# Patient Record
Sex: Male | Born: 1975 | Race: Black or African American | Hispanic: No | Marital: Married | Smoking: Current every day smoker
Health system: Southern US, Community
[De-identification: ages and names within clinical notes are randomized; demographics above are authoritative.]

## PROBLEM LIST (undated history)

## (undated) DIAGNOSIS — R0602 Shortness of breath: Secondary | ICD-10-CM

## (undated) DIAGNOSIS — E739 Lactose intolerance, unspecified: Secondary | ICD-10-CM

## (undated) DIAGNOSIS — K5900 Constipation, unspecified: Secondary | ICD-10-CM

## (undated) DIAGNOSIS — F1010 Alcohol abuse, uncomplicated: Secondary | ICD-10-CM

## (undated) DIAGNOSIS — R12 Heartburn: Secondary | ICD-10-CM

## (undated) DIAGNOSIS — G4730 Sleep apnea, unspecified: Secondary | ICD-10-CM

## (undated) DIAGNOSIS — I10 Essential (primary) hypertension: Secondary | ICD-10-CM

## (undated) HISTORY — DX: Shortness of breath: R06.02

## (undated) HISTORY — DX: Constipation, unspecified: K59.00

## (undated) HISTORY — DX: Sleep apnea, unspecified: G47.30

## (undated) HISTORY — DX: Essential (primary) hypertension: I10

## (undated) HISTORY — DX: Alcohol abuse, uncomplicated: F10.10

## (undated) HISTORY — DX: Heartburn: R12

## (undated) HISTORY — PX: NO PAST SURGERIES: SHX2092

## (undated) HISTORY — DX: Lactose intolerance, unspecified: E73.9

---

## 2004-09-12 ENCOUNTER — Ambulatory Visit: Payer: Self-pay | Admitting: Internal Medicine

## 2005-05-02 ENCOUNTER — Emergency Department (HOSPITAL_COMMUNITY): Admission: EM | Admit: 2005-05-02 | Discharge: 2005-05-02 | Payer: Self-pay | Admitting: Emergency Medicine

## 2005-09-22 DIAGNOSIS — M19019 Primary osteoarthritis, unspecified shoulder: Secondary | ICD-10-CM | POA: Insufficient documentation

## 2007-10-29 ENCOUNTER — Ambulatory Visit: Payer: Self-pay | Admitting: Family Medicine

## 2008-04-26 IMAGING — CT CT HEAD WITHOUT CONTRAST
2 series · 16 of 30 positions shown, 20 images · non-contrast
Comparison: none

REASON FOR EXAM: rt facial numbness
COMMENTS:

[Series 2: without · axial · non-contrast · 0.40mm/px · z∈[-151,-21]mm · 13 of 32 slices shown, 17 images]
[im 3/32  brain]
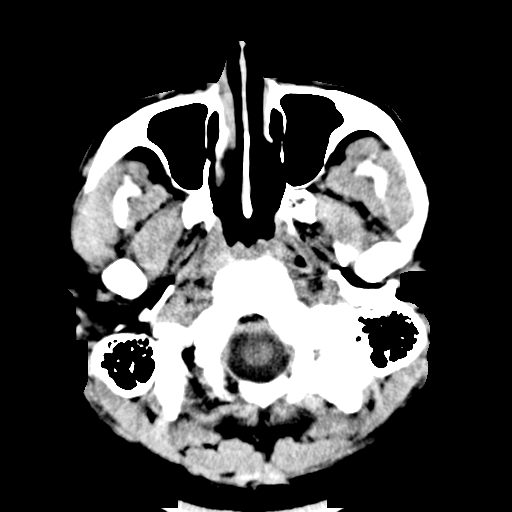
[im 3/32  bone]
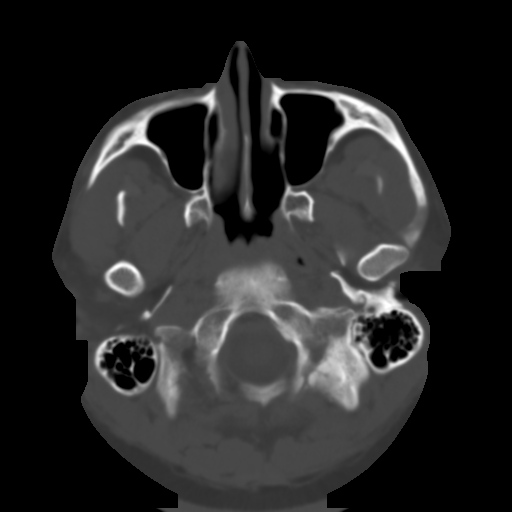
[im 5/32  brain]
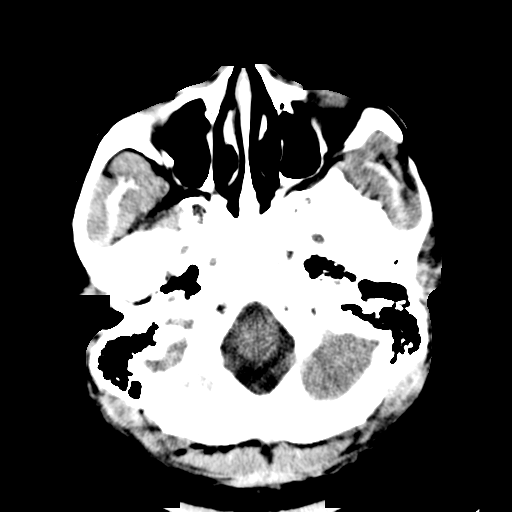
[im 7/32  brain]
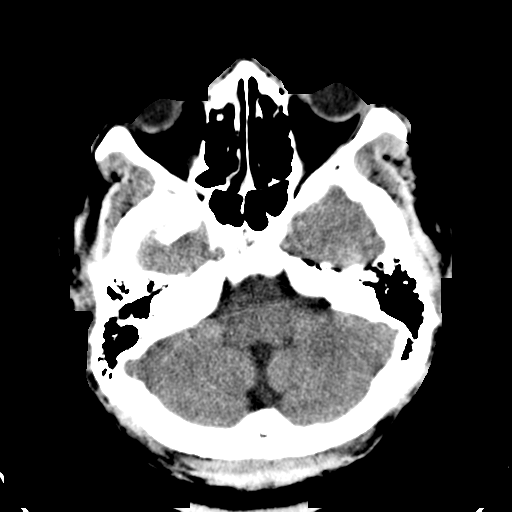
[im 9/32  brain]
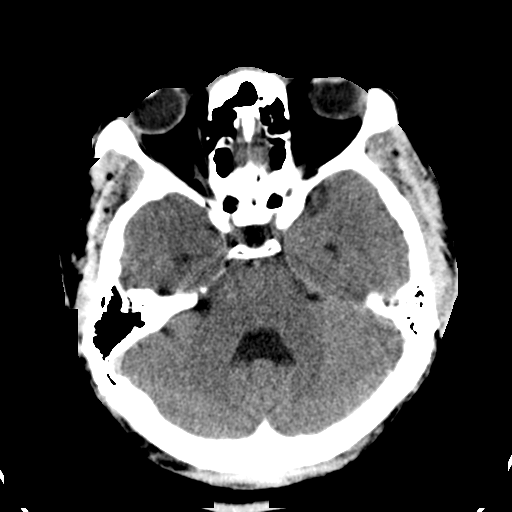
[im 12/32  brain]
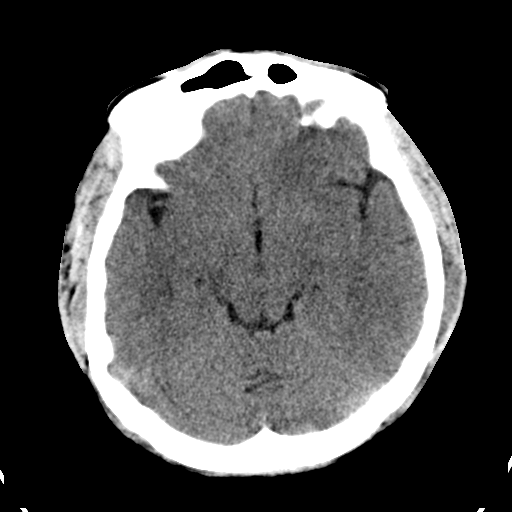
[im 12/32  bone]
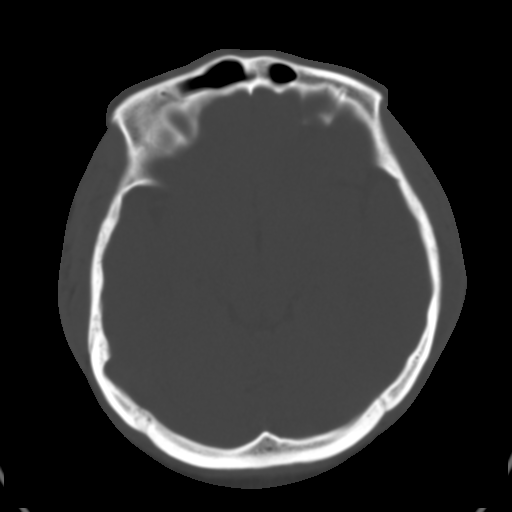
[im 14/32  brain]
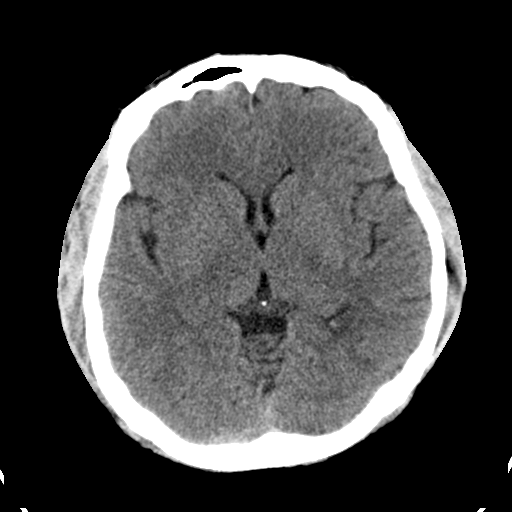
[im 16/32  brain]
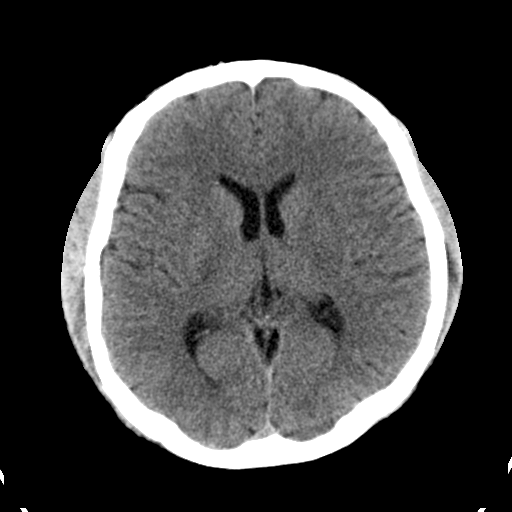
[im 18/32  brain]
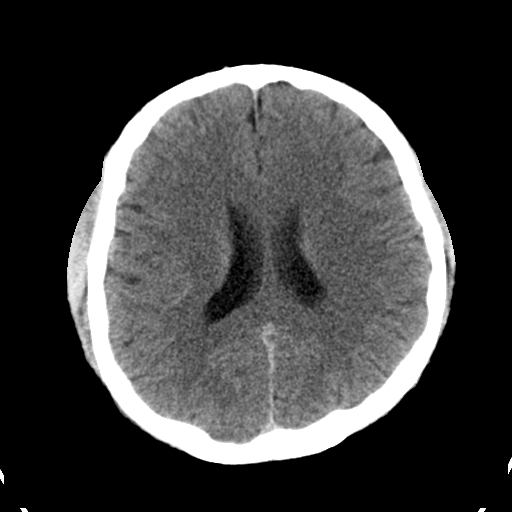
[im 20/32  brain]
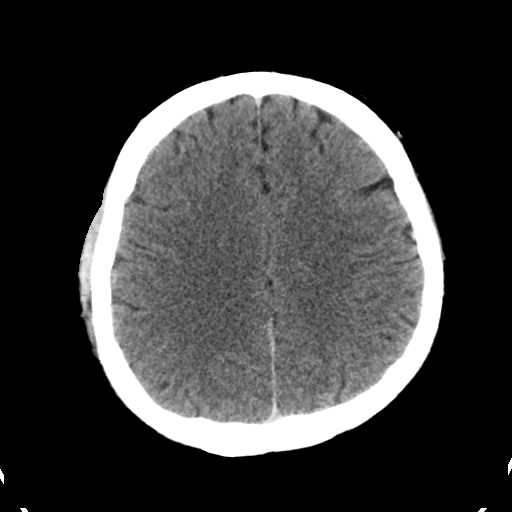
[im 20/32  bone]
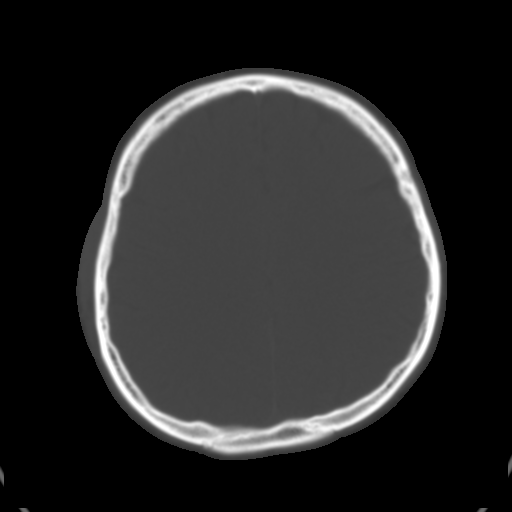
[im 23/32  brain]
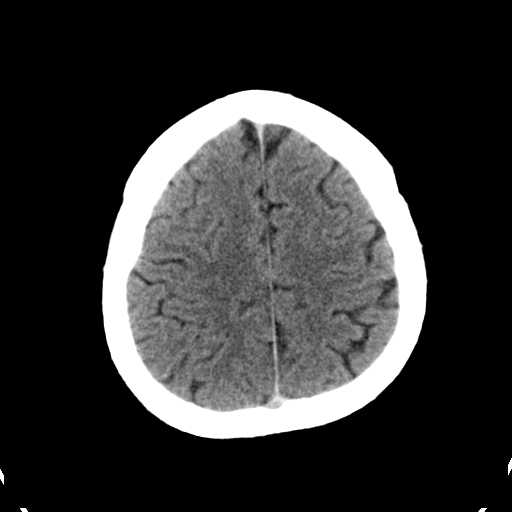
[im 25/32  brain]
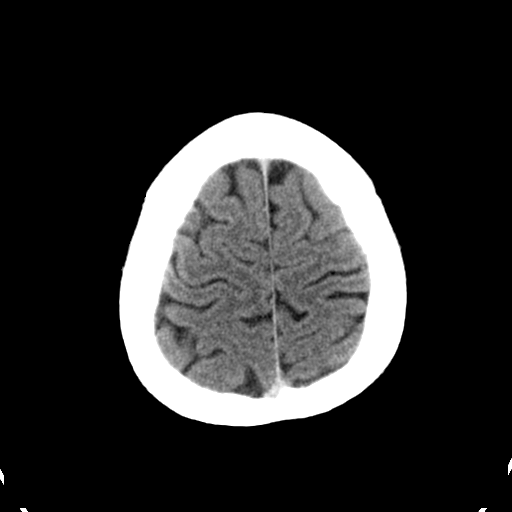
[im 27/32  brain]
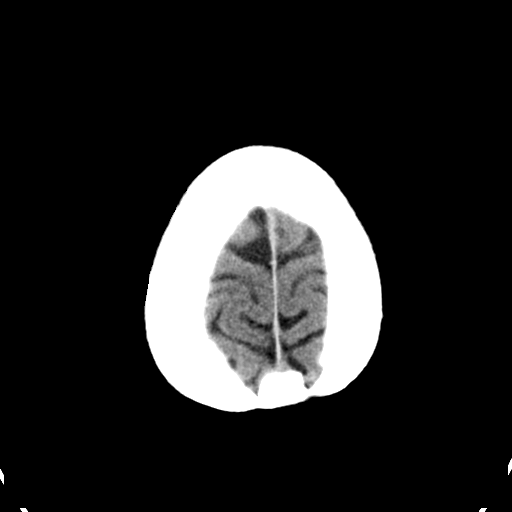
[im 29/32  brain]
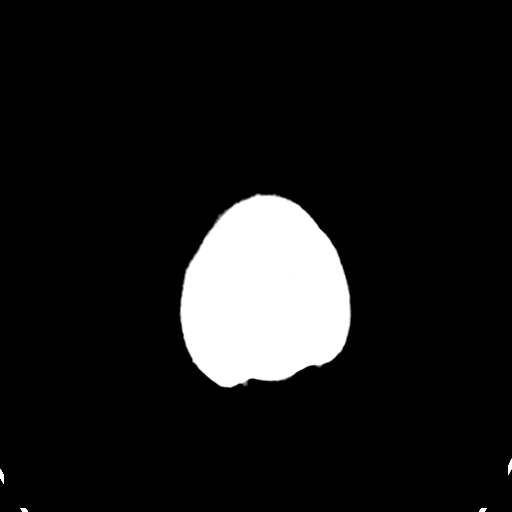
[im 29/32  bone]
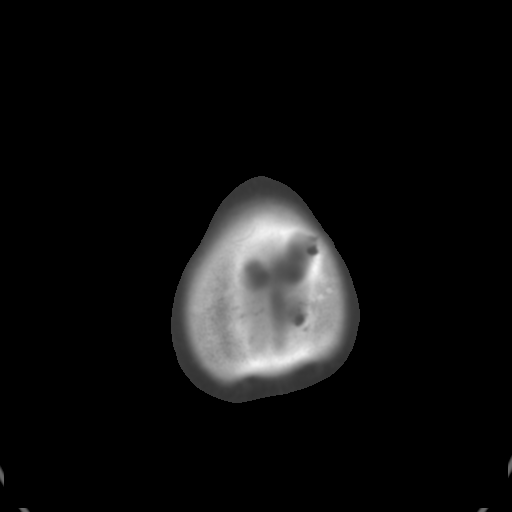

[Series 3: bone · axial · 0.40mm/px · z∈[-151,-106]mm · 3 of 32 slices shown]
[im 3/32  bone]
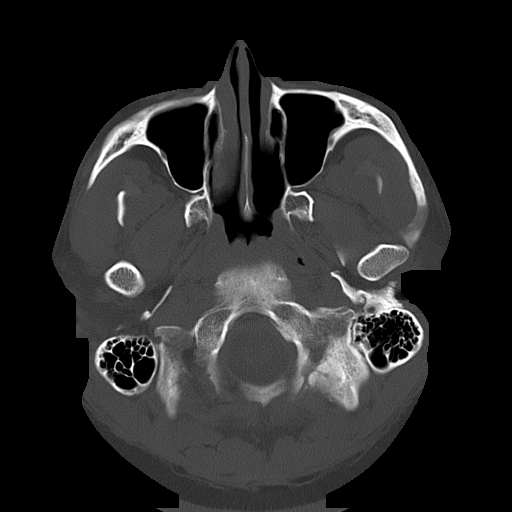
[im 7/32  bone]
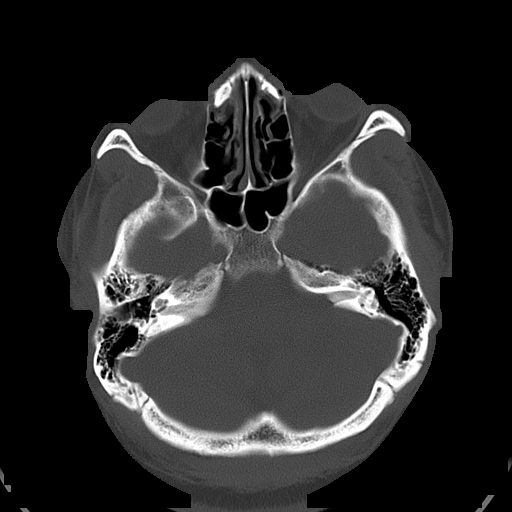
[im 12/32  bone]
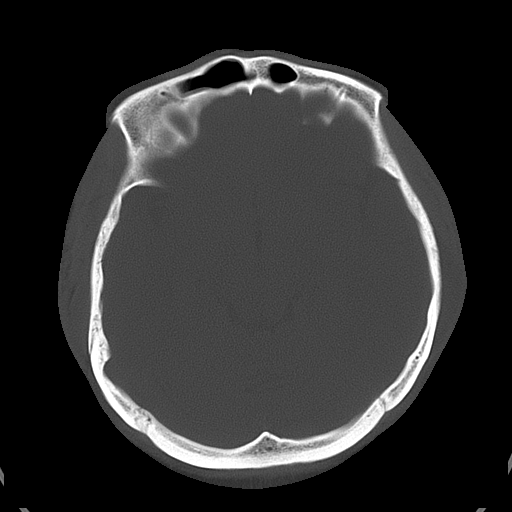

[16 of 30 positions shown; findings below may reference images not displayed]

PROCEDURE:     CT  - CT HEAD WITHOUT CONTRAST  - October 29, 2007  [DATE]

RESULT:     There is no evidence of intra-axial or extra-axial fluid
collections.  There is no evidence of acute hemorrhage or secondary signs
reflecting mass effect, subacute or chronic infarction.  The visualized bony
skeleton is evaluated and there is no evidence of depressed skull fracture
or further fracture or dislocation.  The visualized mastoid air cells are
clear.  The ventricles, cisterns and sulci are symmetric and patent.
IMPRESSION: 1)No evidence of acute intracranial abnormalities as described above.

2)If there is persistent clinical concern, further evaluation with Brain MRI
is recommended if clinically warranted.

## 2009-03-20 DIAGNOSIS — I10 Essential (primary) hypertension: Secondary | ICD-10-CM | POA: Insufficient documentation

## 2009-03-20 DIAGNOSIS — Z720 Tobacco use: Secondary | ICD-10-CM | POA: Insufficient documentation

## 2015-06-19 ENCOUNTER — Other Ambulatory Visit: Payer: Self-pay | Admitting: Family Medicine

## 2015-06-19 NOTE — Telephone Encounter (Signed)
Last Ov was on 06/22/2013.

## 2015-12-07 ENCOUNTER — Other Ambulatory Visit: Payer: Self-pay | Admitting: Family Medicine

## 2015-12-08 NOTE — Telephone Encounter (Signed)
Last OV 06/2013  Thanks,   -Vernona RiegerLaura

## 2015-12-12 ENCOUNTER — Other Ambulatory Visit: Payer: Self-pay | Admitting: Family Medicine

## 2015-12-12 NOTE — Telephone Encounter (Signed)
Last OV 06/2013

## 2015-12-14 MED ORDER — TRIAMTERENE-HCTZ 37.5-25 MG PO TABS: 2.0000 | ORAL_TABLET | Freq: Every day | ORAL | Status: DC

## 2015-12-14 NOTE — Addendum Note (Signed)
Addended by: Leo Grosser on: 12/14/2015 08:52 AM   Modules accepted: Orders

## 2016-01-18 ENCOUNTER — Other Ambulatory Visit: Payer: Self-pay | Admitting: Family Medicine

## 2016-01-18 DIAGNOSIS — I10 Essential (primary) hypertension: Secondary | ICD-10-CM

## 2016-01-19 ENCOUNTER — Other Ambulatory Visit: Payer: Self-pay | Admitting: Family Medicine

## 2016-01-19 DIAGNOSIS — I10 Essential (primary) hypertension: Secondary | ICD-10-CM

## 2016-01-27 ENCOUNTER — Ambulatory Visit (INDEPENDENT_AMBULATORY_CARE_PROVIDER_SITE_OTHER): Payer: Managed Care, Other (non HMO) | Admitting: Family Medicine

## 2016-01-27 ENCOUNTER — Encounter: Payer: Self-pay | Admitting: Family Medicine

## 2016-01-27 VITALS — BP 172/120 | HR 84 | Temp 97.9°F | Resp 16 | Ht 69.0 in | Wt 234.0 lb

## 2016-01-27 DIAGNOSIS — E7800 Pure hypercholesterolemia, unspecified: Secondary | ICD-10-CM | POA: Insufficient documentation

## 2016-01-27 DIAGNOSIS — R748 Abnormal levels of other serum enzymes: Secondary | ICD-10-CM | POA: Insufficient documentation

## 2016-01-27 DIAGNOSIS — I10 Essential (primary) hypertension: Secondary | ICD-10-CM | POA: Diagnosis not present

## 2016-01-27 DIAGNOSIS — G4730 Sleep apnea, unspecified: Secondary | ICD-10-CM | POA: Insufficient documentation

## 2016-01-27 DIAGNOSIS — E669 Obesity, unspecified: Secondary | ICD-10-CM | POA: Insufficient documentation

## 2016-01-27 DIAGNOSIS — J309 Allergic rhinitis, unspecified: Secondary | ICD-10-CM | POA: Insufficient documentation

## 2016-01-27 LAB — POCT URINALYSIS DIPSTICK
Bilirubin, UA: NEGATIVE
Glucose, UA: NEGATIVE
KETONES UA: NEGATIVE
Leukocytes, UA: NEGATIVE
Nitrite, UA: NEGATIVE
PH UA: 7.5
PROTEIN UA: NEGATIVE
SPEC GRAV UA: 1.01
Urobilinogen, UA: 0.2

## 2016-01-27 MED ORDER — METOPROLOL SUCCINATE ER 25 MG PO TB24: 25.0000 mg | ORAL_TABLET | Freq: Every day | ORAL | Status: DC

## 2016-01-27 MED ORDER — TRIAMTERENE-HCTZ 37.5-25 MG PO TABS
2.0000 | ORAL_TABLET | Freq: Every day | ORAL | Status: DC
Start: 2016-01-27 — End: 2016-04-29

## 2016-01-27 MED ORDER — AMLODIPINE BESYLATE 5 MG PO TABS: 5.0000 mg | ORAL_TABLET | Freq: Every day | ORAL | Status: DC

## 2016-01-27 NOTE — Progress Notes (Signed)
Patient ID: Arthur Bradley, male   DOB: [birth year], 40 y.o.   MRN: [id number]        Patient: Arthur Bradley Male    DOB: [birth year]   40 y.o.   MRN: [id number] Visit Date: 01/27/2016  Today's Provider: Lorie PhenixNancy Nosson Wender, MD   Chief Complaint  Patient presents with  . Hypertension  . Hyperlipidemia   Subjective:    HPI     Hypertension, follow-up:  BP Readings from Last 3 Encounters:  01/27/16 172/120  06/22/13 172/118    He was last seen for hypertension 2 years ago.  Management since that visit includes Started Metoprolol.He reports fair compliance with treatment.  Pt says he ran out of his Metoprolol  He is not having side effects.  He is exercising. He is not adherent to low salt diet.   Outside blood pressures are pt says his "Blood has been running high". He is experiencing none.  Patient denies chest pain.   Cardiovascular risk factors include dyslipidemia and male gender.  ------------------------------------------------------------------------    Lipid/Cholesterol, Follow-up:   Last seen for this 2 years ago.  Management since that visit includes None.  Last Lipid Panel: No results found for: CHOL, TRIG, HDL, CHOLHDL, VLDL, LDLCALC, LDLDIRECT   Wt Readings from Last 3 Encounters:  01/27/16 234 lb (106.142 kg)  06/22/13 240 lb (108.863 kg)    ------------------------------------------------------------------------   Has sleep apnea.   Does use his CPAP most of the time.       No Known Allergies Previous Medications   TRIAMTERENE-HYDROCHLOROTHIAZIDE (MAXZIDE-25) 37.5-25 MG TABLET    Take 2 tablets by mouth daily. Pt needs an office visit before anymore refills.    Review of Systems  Constitutional: Positive for fatigue. Negative for fever, chills, diaphoresis, activity change, appetite change and unexpected weight change.  Respiratory: Positive for cough (Occasionally). Negative for apnea, choking, chest tightness, shortness of breath, wheezing  and stridor.   Cardiovascular: Negative.   Gastrointestinal: Negative.   Musculoskeletal: Negative.   Neurological: Negative for dizziness, tremors, seizures, syncope, weakness, light-headedness and headaches.    Social History  Substance Use Topics  . Smoking status: Current Every Day Smoker -- 0 years    Types: E-cigarettes  . Smokeless tobacco: Never Used  . Alcohol Use: Yes     Comment: Rarely   Objective:   BP 172/120 mmHg  Pulse 84  Temp(Src) 97.9 F (36.6 C) (Oral)  Resp 16  Ht 5\' 9"  (1.753 m)  Wt 234 lb (106.142 kg)  BMI 34.54 kg/m2  Physical Exam  Constitutional: He is oriented to person, place, and time. He appears well-developed and well-nourished.  Cardiovascular: Normal rate, regular rhythm and normal heart sounds.   Pulmonary/Chest: Effort normal and breath sounds normal.  Neurological: He is alert and oriented to person, place, and time.  Psychiatric: He has a normal mood and affect. His behavior is normal. Judgment and thought content normal.      Assessment & Plan:       1. Benign essential HTN Elevated today; but pt reports he ran out of his medications.  Will restart today and recheck in six weeks.   - amLODipine (NORVASC) 5 MG tablet; Take 1 tablet (5 mg total) by mouth daily.  Dispense: 90 tablet; Refill: 1 - CBC with Differential/Platelet - Comprehensive metabolic panel - TSH - metoprolol succinate (TOPROL-XL) 25 MG 24 hr tablet; Take 1 tablet (25 mg total) by mouth daily.  Dispense: 90 tablet; Refill: 1  2. Hypercholesteremia  Will not check labs today as not fasting, High Ck previously. Will recheck  At follow up.    3. Apnea, sleep Pt reports using his Cpap but not every night.  Encourage pt to use his machine every night.    4. Essential hypertension, benign Elevated today; but pt reports he ran out of his medications.  Will restart today and recheck in six weeks.   - triamterene-hydrochlorothiazide (MAXZIDE-25) 37.5-25 MG tablet; Take 2  tablets by mouth daily.  Dispense: 180 tablet; Refill: 1  5. Elevated CK Has been elevated in the past, will recheck today.  - CK (Creatine Kinase)    Patient was seen and examined by Leo Grosser, MD, and note scribed by Kavin Leech, CMA.  I have reviewed the document for accuracy and completeness and I agree with above. - Leo Grosser, MD   Lorie Phenix, MD  [facility]

## 2016-01-28 ENCOUNTER — Telehealth: Payer: Self-pay | Admitting: Family Medicine

## 2016-01-28 DIAGNOSIS — R748 Abnormal levels of other serum enzymes: Secondary | ICD-10-CM

## 2016-01-28 LAB — COMPREHENSIVE METABOLIC PANEL
A/G RATIO: 1.9 (ref 1.1–2.5)
ALK PHOS: 56 IU/L (ref 39–117)
ALT: 55 IU/L — AB (ref 0–44)
AST: 78 IU/L — AB (ref 0–40)
Albumin: 5.3 g/dL (ref 3.5–5.5)
BUN/Creatinine Ratio: 15 (ref 8–19)
BUN: 19 mg/dL (ref 6–20)
Bilirubin Total: 0.6 mg/dL (ref 0.0–1.2)
CALCIUM: 10.5 mg/dL — AB (ref 8.7–10.2)
CO2: 25 mmol/L (ref 18–29)
CREATININE: 1.27 mg/dL (ref 0.76–1.27)
Chloride: 94 mmol/L — ABNORMAL LOW (ref 96–106)
GFR calc Af Amer: 82 mL/min/{1.73_m2} (ref >59)
GFR, EST NON AFRICAN AMERICAN: 71 mL/min/{1.73_m2} (ref >59)
Globulin, Total: 2.8 g/dL (ref 1.5–4.5)
Glucose: 105 mg/dL — ABNORMAL HIGH (ref 65–99)
POTASSIUM: 4.2 mmol/L (ref 3.5–5.2)
Sodium: 139 mmol/L (ref 134–144)
Total Protein: 8.1 g/dL (ref 6.0–8.5)

## 2016-01-28 LAB — CBC WITH DIFFERENTIAL/PLATELET
BASOS ABS: 0 10*3/uL (ref 0.0–0.2)
BASOS: 1 %
EOS (ABSOLUTE): 0.1 10*3/uL (ref 0.0–0.4)
EOS: 3 %
Hematocrit: 46.5 % (ref 37.5–51.0)
Hemoglobin: 15.7 g/dL (ref 12.6–17.7)
IMMATURE GRANULOCYTES: 0 %
Immature Grans (Abs): 0 10*3/uL (ref 0.0–0.1)
LYMPHS ABS: 1.5 10*3/uL (ref 0.7–3.1)
LYMPHS: 39 %
MCH: 30 pg (ref 26.6–33.0)
MCHC: 33.8 g/dL (ref 31.5–35.7)
MCV: 89 fL (ref 79–97)
MONOCYTES: 12 %
Monocytes Absolute: 0.5 10*3/uL (ref 0.1–0.9)
NEUTROS ABS: 1.8 10*3/uL (ref 1.4–7.0)
Neutrophils: 45 %
PLATELETS: 244 10*3/uL (ref 150–379)
RBC: 5.23 x10E6/uL (ref 4.14–5.80)
RDW: 15.5 % — AB (ref 12.3–15.4)
WBC: 3.9 10*3/uL (ref 3.4–10.8)

## 2016-01-28 LAB — TSH: TSH: 3.14 u[IU]/mL (ref 0.450–4.500)

## 2016-01-28 LAB — CK: CK TOTAL: 3850 U/L — AB (ref 24–204)

## 2016-01-30 NOTE — Telephone Encounter (Signed)
Lab slip printed for patient.

## 2016-01-31 LAB — COMPREHENSIVE METABOLIC PANEL
A/G RATIO: 1.7 (ref 1.1–2.5)
ALT: 62 IU/L — AB (ref 0–44)
AST: 52 IU/L — AB (ref 0–40)
Albumin: 5.1 g/dL (ref 3.5–5.5)
Alkaline Phosphatase: 46 IU/L (ref 39–117)
BILIRUBIN TOTAL: 0.8 mg/dL (ref 0.0–1.2)
BUN/Creatinine Ratio: 12 (ref 8–19)
BUN: 18 mg/dL (ref 6–20)
CHLORIDE: 93 mmol/L — AB (ref 96–106)
CO2: 23 mmol/L (ref 18–29)
Calcium: 10.2 mg/dL (ref 8.7–10.2)
Creatinine, Ser: 1.48 mg/dL — ABNORMAL HIGH (ref 0.76–1.27)
GFR calc non Af Amer: 59 mL/min/{1.73_m2} — ABNORMAL LOW (ref >59)
GFR, EST AFRICAN AMERICAN: 68 mL/min/{1.73_m2} (ref >59)
GLOBULIN, TOTAL: 3 g/dL (ref 1.5–4.5)
Glucose: 96 mg/dL (ref 65–99)
POTASSIUM: 4.2 mmol/L (ref 3.5–5.2)
SODIUM: 136 mmol/L (ref 134–144)
TOTAL PROTEIN: 8.1 g/dL (ref 6.0–8.5)

## 2016-01-31 LAB — CK: Total CK: 1405 U/L (ref 24–204)

## 2016-02-02 ENCOUNTER — Telehealth: Payer: Self-pay

## 2016-02-02 DIAGNOSIS — R748 Abnormal levels of other serum enzymes: Secondary | ICD-10-CM

## 2016-02-02 NOTE — Telephone Encounter (Signed)
-----   Message from Lorie PhenixNancy Maloney, MD sent at 01/31/2016  7:51 AM EST ----- CK improved but still off some. Kidney function and liver function also slightly off. Recommend rheumatology referral to evaluate further. Thanks.

## 2016-02-02 NOTE — Telephone Encounter (Signed)
Advised patient as below. Please schedule patient's appt in the morning per patient request. Thanks!

## 2016-02-02 NOTE — Telephone Encounter (Signed)
Left message to call back  

## 2016-02-25 ENCOUNTER — Telehealth: Payer: Self-pay | Admitting: Family Medicine

## 2016-02-25 NOTE — Telephone Encounter (Signed)
Pt called b/c he was confused about his referral. Pt stated that he thought he was going to have his kidneys check but when he [facility] he was advised that they don't treat for kidneys. Please advise was pt supposed to see Urology or Rheumatology?  Thanks TNP

## 2016-02-25 NOTE — Telephone Encounter (Signed)
Patient has scheduled appt 03/08/2016. sd

## 2016-03-08 ENCOUNTER — Ambulatory Visit (INDEPENDENT_AMBULATORY_CARE_PROVIDER_SITE_OTHER): Payer: Managed Care, Other (non HMO) | Admitting: Family Medicine

## 2016-03-08 ENCOUNTER — Encounter: Payer: Self-pay | Admitting: Family Medicine

## 2016-03-08 VITALS — BP 138/96 | HR 84 | Temp 97.9°F | Resp 16 | Wt 234.0 lb

## 2016-03-08 DIAGNOSIS — I10 Essential (primary) hypertension: Secondary | ICD-10-CM | POA: Diagnosis not present

## 2016-03-08 DIAGNOSIS — R748 Abnormal levels of other serum enzymes: Secondary | ICD-10-CM

## 2016-03-08 DIAGNOSIS — N289 Disorder of kidney and ureter, unspecified: Secondary | ICD-10-CM

## 2016-03-08 MED ORDER — AMLODIPINE BESYLATE 10 MG PO TABS: 10.0000 mg | ORAL_TABLET | Freq: Every day | ORAL | Status: DC

## 2016-03-08 NOTE — Progress Notes (Signed)
Patient ID: Arthur Bradley, male   DOB: 1976-11-15, 40 y.o.   MRN: [id number]         Patient: Arthur Bradley Male    DOB: 1976-11-15   40 y.o.   MRN: [id number] Visit Date: 03/08/2016  Today's Provider: Lorie PhenixNancy Jakeb Lamping, MD   Chief Complaint  Patient presents with  . Hypertension   Subjective:    Hypertension This is a chronic problem. The problem is unchanged. The problem is controlled. Pertinent negatives include no anxiety, blurred vision, chest pain, headaches, malaise/fatigue, peripheral edema, PND or shortness of breath. Risk factors for coronary artery disease include dyslipidemia, male gender and obesity. Past treatments include ACE inhibitors, beta blockers and diuretics. The current treatment provides moderate improvement. There are no compliance problems.    BP Readings from Last 3 Encounters:  03/08/16 138/96  01/27/16 172/120  06/22/13 172/118        No Known Allergies Previous Medications   METOPROLOL SUCCINATE (TOPROL-XL) 25 MG 24 HR TABLET    Take 1 tablet (25 mg total) by mouth daily.   TRIAMTERENE-HYDROCHLOROTHIAZIDE (MAXZIDE-25) 37.5-25 MG TABLET    Take 2 tablets by mouth daily.    Review of Systems  Constitutional: Negative.  Negative for malaise/fatigue.  Eyes: Negative for blurred vision.  Respiratory: Negative.  Negative for shortness of breath.   Cardiovascular: Negative.  Negative for chest pain and PND.  Gastrointestinal: Negative.   Neurological: Negative for dizziness, light-headedness and headaches.    Social History  Substance Use Topics  . Smoking status: Current Every Day Smoker -- 0 years    Types: E-cigarettes  . Smokeless tobacco: Never Used  . Alcohol Use: Yes     Comment: Rarely   Objective:   BP 138/96 mmHg  Pulse 84  Temp(Src) 97.9 F (36.6 C) (Oral)  Resp 16  Wt 234 lb (106.142 kg)  Physical Exam  Constitutional: He is oriented to person, place, and time. He appears well-developed and well-nourished.  Cardiovascular:  Normal rate and regular rhythm.   Pulmonary/Chest: Effort normal and breath sounds normal.  Neurological: He is alert and oriented to person, place, and time.  Skin: Skin is warm and dry.  Psychiatric: He has a normal mood and affect. His behavior is normal. Judgment and thought content normal.         Assessment & Plan:     1. Essential hypertension, benign Improved from last visit, but not to goal yet.  Will increased Amlodipine 10mg  a day recheck in about 4-6 weeks.   - amLODipine (NORVASC) 10 MG tablet; Take 1 tablet (10 mg total) by mouth daily.  Dispense: 90 tablet; Refill: 1  2. Elevated CK Will refer to Rheumatology to evaluate and treat.   - Ambulatory referral to Rheumatology  3. Function kidney decreased Could be related to BP but will refer to evaluate and treat.  - Ambulatory referral to Nephrology   Patient was seen and examined by Leo GrosserNancy J. Alexus Michael, MD, and note scribed by Kavin LeechLaura Walsh, CMA.  I have reviewed the document for accuracy and completeness and I agree with above. - Leo GrosserNancy J. Yonael Tulloch, MD      Lorie PhenixNancy Jess Sulak, MD  [facility]

## 2016-03-15 DIAGNOSIS — R7989 Other specified abnormal findings of blood chemistry: Secondary | ICD-10-CM | POA: Insufficient documentation

## 2016-04-23 ENCOUNTER — Ambulatory Visit: Payer: Managed Care, Other (non HMO) | Admitting: Family Medicine

## 2016-04-26 ENCOUNTER — Ambulatory Visit: Payer: Managed Care, Other (non HMO) | Admitting: Family Medicine

## 2016-04-29 ENCOUNTER — Ambulatory Visit (INDEPENDENT_AMBULATORY_CARE_PROVIDER_SITE_OTHER): Payer: Managed Care, Other (non HMO) | Admitting: Family Medicine

## 2016-04-29 ENCOUNTER — Encounter: Payer: Self-pay | Admitting: Family Medicine

## 2016-04-29 VITALS — BP 116/88 | HR 80 | Temp 97.9°F | Resp 16 | Wt 240.0 lb

## 2016-04-29 DIAGNOSIS — N529 Male erectile dysfunction, unspecified: Secondary | ICD-10-CM | POA: Insufficient documentation

## 2016-04-29 DIAGNOSIS — N522 Drug-induced erectile dysfunction: Secondary | ICD-10-CM | POA: Diagnosis not present

## 2016-04-29 DIAGNOSIS — I10 Essential (primary) hypertension: Secondary | ICD-10-CM

## 2016-04-29 MED ORDER — TRIAMTERENE-HCTZ 37.5-25 MG PO TABS: 2.0000 | ORAL_TABLET | Freq: Every day | ORAL | Status: DC

## 2016-04-29 MED ORDER — METOPROLOL SUCCINATE ER 25 MG PO TB24: 25.0000 mg | ORAL_TABLET | Freq: Every day | ORAL | Status: DC

## 2016-04-29 MED ORDER — SILDENAFIL CITRATE 50 MG PO TABS: 50.0000 mg | ORAL_TABLET | Freq: Every day | ORAL | Status: DC | PRN

## 2016-04-29 MED ORDER — AMLODIPINE BESYLATE 10 MG PO TABS: 10.0000 mg | ORAL_TABLET | Freq: Every day | ORAL | Status: DC

## 2016-04-29 NOTE — Progress Notes (Signed)
Subjective:    Patient ID: Arthur Bradley, male    DOB: Dec 15, 1975, 40 y.o.   MRN: [id number]  Hypertension This is a chronic problem. The current episode started more than 1 month ago. The problem has been gradually improving since onset. The problem is controlled. Pertinent negatives include no anxiety, chest pain, palpitations or shortness of breath. Risk factors for coronary artery disease include male gender and obesity. Past treatments include ACE inhibitors, beta blockers and diuretics. The current treatment provides significant improvement. There are no compliance problems.  Having some trouble with erection and decreaed libido. .    Patient Active Problem List   Diagnosis Date Noted  . Creatinine elevation 03/15/2016  . Allergic rhinitis 01/27/2016  . Elevated CK 01/27/2016  . Hypercholesteremia 01/27/2016  . Adiposity 01/27/2016  . Apnea, sleep 01/27/2016  . Essential hypertension, benign 01/19/2016  . Benign essential HTN 03/20/2009  . Current tobacco use 03/20/2009  . 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 Current Outpatient Prescriptions on File Prior to Visit  Medication Sig  . amLODipine (NORVASC) 10 MG tablet Take 1 tablet (10 mg total) by mouth daily.  . metoprolol succinate (TOPROL-XL) 25 MG 24 hr tablet Take 1 tablet (25 mg total) by mouth daily.  Marland Kitchen triamterene-hydrochlorothiazide (MAXZIDE-25) 37.5-25 MG tablet Take 2 tablets by mouth daily.   No current facility-administered medications on file prior to visit.   No Known Allergies Past Surgical History  Procedure Laterality Date  . No past surgeries     Social History   Social History  . Marital Status: Married    Spouse Name: N/A  . Number of Children: N/A  . Years of Education: N/A   Occupational History  . Full-time    Social History Main Topics  . Smoking status: Current Every Day Smoker -- 0 years    Types: E-cigarettes  . Smokeless tobacco: Never Used    . Alcohol Use: Yes     Comment: Rarely  . Drug Use: No  . Sexual Activity: Not on file   Other Topics Concern  . Not on file   Social History Narrative   Family History  Problem Relation Age of Onset  . Arthritis Mother   . Hypertension Father   . Heart attack Father   . Hypertension Sister   . Diabetes Paternal Uncle    Review of Systems  Constitutional: Positive for activity change (decreased work outs). Negative for fever, chills, diaphoresis, appetite change, fatigue and unexpected weight change.  Respiratory: Negative for cough, shortness of breath and wheezing.   Cardiovascular: Negative for chest pain, palpitations and leg swelling.  Psychiatric/Behavioral: Negative.        Objective:   Physical Exam  Constitutional: He is oriented to person, place, and time. He appears well-developed and well-nourished.  Cardiovascular: Normal rate and regular rhythm.   Pulmonary/Chest: Effort normal and breath sounds normal.  Neurological: He is alert and oriented to person, place, and time.  Psychiatric: He has a normal mood and affect. His behavior is normal. Judgment and thought content normal.   BP 116/88 mmHg  Pulse 80  Temp(Src) 97.9 F (36.6 C) (Oral)  Resp 16  Wt 240 lb (108.863 kg)     Assessment & Plan:  1. Drug-induced erectile dysfunction New problem. Will start medication. Warned about headache, chest pain, and possible hearing and vision loss.   Would like to proceed.  Patient instructed to call back if condition worsens or  does not improve.    - sildenafil (VIAGRA) 50 MG tablet; Take 1 tablet (50 mg total) by mouth daily as needed for erectile dysfunction.  Dispense: 10 tablet; Refill: 1  2. Essential hypertension, benign Condition is stable. Please continue current medication and  plan of care as noted.   - amLODipine (NORVASC) 10 MG tablet; Take 1 tablet (10 mg total) by mouth daily.  Dispense: 90 tablet; Refill: 1 - triamterene-hydrochlorothiazide  (MAXZIDE-25) 37.5-25 MG tablet; Take 2 tablets by mouth daily.  Dispense: 180 tablet; Refill: 1  3. Benign essential HTN Condition is stable. Please continue current medication and  plan of care as noted.   - metoprolol succinate (TOPROL-XL) 25 MG 24 hr tablet; Take 1 tablet (25 mg total) by mouth daily.  Dispense: 90 tablet; Refill: 1   Lorie PhenixNancy Smriti Barkow, MD

## 2016-07-21 ENCOUNTER — Other Ambulatory Visit: Payer: Self-pay | Admitting: Family Medicine

## 2016-07-21 DIAGNOSIS — I10 Essential (primary) hypertension: Secondary | ICD-10-CM

## 2016-07-21 NOTE — Telephone Encounter (Signed)
Why is pharmacy requesting the 5mg  tablet, last rx was for 10.

## 2016-08-08 ENCOUNTER — Other Ambulatory Visit: Payer: Self-pay | Admitting: Family Medicine

## 2016-08-08 DIAGNOSIS — I10 Essential (primary) hypertension: Secondary | ICD-10-CM

## 2016-10-04 ENCOUNTER — Ambulatory Visit: Payer: Managed Care, Other (non HMO) | Admitting: Family Medicine

## 2016-11-13 ENCOUNTER — Other Ambulatory Visit: Payer: Self-pay | Admitting: Family Medicine

## 2016-11-13 DIAGNOSIS — I10 Essential (primary) hypertension: Secondary | ICD-10-CM

## 2016-11-16 NOTE — Telephone Encounter (Signed)
Last ov 04/29/16; last filled 08/09/16. Please review. Thank you. sd

## 2017-03-01 ENCOUNTER — Other Ambulatory Visit: Payer: Self-pay | Admitting: Family Medicine

## 2017-03-01 DIAGNOSIS — I10 Essential (primary) hypertension: Secondary | ICD-10-CM

## 2017-12-24 ENCOUNTER — Other Ambulatory Visit: Payer: Self-pay | Admitting: Family Medicine

## 2017-12-24 DIAGNOSIS — I10 Essential (primary) hypertension: Secondary | ICD-10-CM

## 2018-01-27 ENCOUNTER — Other Ambulatory Visit: Payer: Self-pay | Admitting: Family Medicine

## 2018-01-27 DIAGNOSIS — I10 Essential (primary) hypertension: Secondary | ICD-10-CM

## 2018-01-27 NOTE — Telephone Encounter (Signed)
Left message to call back to schedule appt

## 2018-01-27 NOTE — Telephone Encounter (Signed)
Patient has not been seen since Dr. Elease HashimotoMaloney left. He needs o.v. Scheduled before any refill can be approved.

## 2018-02-06 NOTE — Telephone Encounter (Signed)
Left message to call back  

## 2018-02-08 NOTE — Telephone Encounter (Signed)
Tried calling patient. No answer. Left message to call back. Letter mailed to patients home.

## 2018-06-09 NOTE — Progress Notes (Signed)
Patient: Arthur Bradley Male    DOB: 10-Sep-1976   42 y.o.   MRN: [id number] Visit Date: 06/12/2018  Today's Provider: Mila Merryonald Haelyn Forgey, MD   Chief Complaint  Patient presents with  . Follow-up  . Hypertension   Subjective:    HPI  This is a previous patient of Dr. Elease HashimotoMaloney present today as new patient to me to establish care and follow up on chronic medical problems.    Hypertension, follow-up:  BP Readings from Last 3 Encounters:  06/12/18 (!) 164/118  04/29/16 116/88  03/08/16 (!) 138/96    He was last seen for hypertension 04/29/2016.  BP at that visit was 116/88. Management since that visit includes; seen by Dr. Elease HashimotoMaloney. No changes.He reports poor compliance with treatment. He is not having side effects. none He is exercising. He is adherent to low salt diet.   Outside blood pressures are per pt running high. He is experiencing none.  Patient denies none.   Cardiovascular risk factors include none.  Use of agents associated with hypertension: none.   ---------------------------------------------------------------    No Known Allergies   Current Outpatient Medications:  .  amLODipine (NORVASC) 10 MG tablet, TAKE 1 TABLET (10 MG TOTAL) BY MOUTH DAILY. (Patient not taking: Reported on 06/12/2018), Disp: 30 tablet, Rfl: 5 .  amLODipine (NORVASC) 5 MG tablet, TAKE 1 TABLET (5 MG TOTAL) BY MOUTH DAILY. (Patient not taking: Reported on 06/12/2018), Disp: 90 tablet, Rfl: 1 .  metoprolol succinate (TOPROL-XL) 25 MG 24 hr tablet, TAKE 1 TABLET (25 MG TOTAL) BY MOUTH DAILY. (Patient not taking: Reported on 06/12/2018), Disp: 90 tablet, Rfl: 1 .  sildenafil (VIAGRA) 50 MG tablet, Take 1 tablet (50 mg total) by mouth daily as needed for erectile dysfunction. (Patient not taking: Reported on 06/12/2018), Disp: 10 tablet, Rfl: 1 .  triamterene-hydrochlorothiazide (MAXZIDE-25) 37.5-25 MG tablet, TAKE 2 TABLETS BY MOUTH DAILY. (Patient not taking: Reported on 06/12/2018), Disp: 60  tablet, Rfl: 0  Review of Systems  Constitutional: Negative for appetite change, chills and fever.  Respiratory: Negative for chest tightness, shortness of breath and wheezing.   Cardiovascular: Negative for chest pain and palpitations.  Gastrointestinal: Negative for abdominal pain, nausea and vomiting.    Social History   Tobacco Use  . Smoking status: Current Every Day Smoker    Years: 0.00    Types: E-cigarettes  . Smokeless tobacco: Never Used  Substance Use Topics  . Alcohol use: Yes    Comment: Rarely   Objective:   BP (!) 164/118 (BP Location: Right Arm, Patient Position: Sitting, Cuff Size: Large)   Pulse 86   Temp 98 F (36.7 C) (Oral)   Resp 16   Ht 5\' 8"  (1.727 m)   Wt 236 lb (107 kg)   SpO2 98%   BMI 35.88 kg/m  Vitals:   06/12/18 1133  BP: (!) 164/118  Pulse: 86  Resp: 16  Temp: 98 F (36.7 C)  TempSrc: Oral  SpO2: 98%  Weight: 236 lb (107 kg)  Height: 5\' 8"  (1.727 m)     Physical Exam   General Appearance:    Alert, cooperative, no distress  Eyes:    PERRL, conjunctiva/corneas clear, EOM's intact       Lungs:     Clear to auscultation bilaterally, respirations unlabored  Heart:    Regular rate and rhythm      Assessment & Plan:     1. Essential hypertension Currently off of medications, but  he has been exercising more and reports he lost 20 pounds. Restart CCB and diuretic. Hold off on starting betablocker for now. Recheck BP in about 2 months. Advised to be fasting for labs at next o.v.  - amLODipine (NORVASC) 5 MG tablet; Take 1 tablet (5 mg total) by mouth daily.  Dispense: 90 tablet; Refill: 1 - triamterene-hydrochlorothiazide (MAXZIDE-25) 37.5-25 MG tablet; Take 1 tablet by mouth daily.  Dispense: 90 tablet; Refill: 1       Mila Merry, MD  [facility]

## 2018-06-12 ENCOUNTER — Ambulatory Visit: Payer: Managed Care, Other (non HMO) | Admitting: Family Medicine

## 2018-06-12 ENCOUNTER — Encounter: Payer: Self-pay | Admitting: Family Medicine

## 2018-06-12 VITALS — BP 160/110 | HR 86 | Temp 98.0°F | Resp 16 | Ht 68.0 in | Wt 236.0 lb

## 2018-06-12 DIAGNOSIS — I10 Essential (primary) hypertension: Secondary | ICD-10-CM | POA: Diagnosis not present

## 2018-06-12 MED ORDER — AMLODIPINE BESYLATE 5 MG PO TABS: 5.0000 mg | ORAL_TABLET | Freq: Every day | ORAL | 1 refills | Status: DC

## 2018-06-12 MED ORDER — TRIAMTERENE-HCTZ 37.5-25 MG PO TABS: 1.0000 | ORAL_TABLET | Freq: Every day | ORAL | 1 refills | Status: DC

## 2018-08-15 ENCOUNTER — Ambulatory Visit: Payer: Self-pay | Admitting: Family Medicine

## 2019-03-25 ENCOUNTER — Other Ambulatory Visit: Payer: Self-pay | Admitting: Family Medicine

## 2019-03-25 DIAGNOSIS — I10 Essential (primary) hypertension: Secondary | ICD-10-CM

## 2019-03-29 ENCOUNTER — Other Ambulatory Visit: Payer: Self-pay | Admitting: Family Medicine

## 2019-03-29 DIAGNOSIS — I10 Essential (primary) hypertension: Secondary | ICD-10-CM

## 2019-09-05 ENCOUNTER — Ambulatory Visit: Payer: No Typology Code available for payment source | Admitting: Family Medicine

## 2019-09-05 ENCOUNTER — Encounter: Payer: Self-pay | Admitting: Family Medicine

## 2019-09-05 ENCOUNTER — Other Ambulatory Visit: Payer: Self-pay

## 2019-09-05 VITALS — BP 180/143 | HR 99 | Temp 97.3°F | Resp 16 | Ht 67.0 in | Wt 219.4 lb

## 2019-09-05 DIAGNOSIS — I10 Essential (primary) hypertension: Secondary | ICD-10-CM

## 2019-09-05 DIAGNOSIS — R112 Nausea with vomiting, unspecified: Secondary | ICD-10-CM | POA: Diagnosis not present

## 2019-09-05 DIAGNOSIS — E7800 Pure hypercholesterolemia, unspecified: Secondary | ICD-10-CM

## 2019-09-05 DIAGNOSIS — R1110 Vomiting, unspecified: Secondary | ICD-10-CM | POA: Insufficient documentation

## 2019-09-05 DIAGNOSIS — H1131 Conjunctival hemorrhage, right eye: Secondary | ICD-10-CM

## 2019-09-05 DIAGNOSIS — Z720 Tobacco use: Secondary | ICD-10-CM | POA: Diagnosis not present

## 2019-09-05 MED ORDER — OMEPRAZOLE 20 MG PO CPDR: 20.0000 mg | DELAYED_RELEASE_CAPSULE | Freq: Every day | ORAL | 3 refills | Status: DC

## 2019-09-05 MED ORDER — AMLODIPINE BESYLATE 10 MG PO TABS: 10.0000 mg | ORAL_TABLET | Freq: Every day | ORAL | 3 refills | Status: DC

## 2019-09-05 NOTE — Patient Instructions (Signed)
Continue Maxzide as prescribed Increase amlodipine to 10mg  daily  Cut back on spicy/acidic foods, smoking, alcohol   Vomiting, Adult Vomiting occurs when stomach contents are thrown up and out of the mouth. Many people notice nausea before vomiting. Vomiting can make you feel weak and cause you to become dehydrated. Dehydration can make you feel tired and thirsty, cause you to have a dry mouth, and decrease how often you urinate. Older adults and people who have other diseases or a weak body defense system (immune system) are at higher risk for dehydration. It is important to treat vomiting as told by your health care provider. Follow these instructions at home:  Eating and drinking     Follow these recommendations as told by your health care provider:  Take an oral rehydration solution (ORS). This is a drink that is sold at pharmacies and retail stores.  Eat bland, easy-to-digest foods in small amounts as you are able. These foods include bananas, applesauce, rice, lean meats, toast, and crackers.  Drink clear fluids slowly and in small amounts as you are able. Clear fluids include water, ice chips, low-calorie sports drinks, and fruit juice that has water added (diluted fruit juice).  Avoid drinking fluids that contain a lot of sugar or caffeine, such as energy drinks, sports drinks, and soda.  Avoid alcohol.  Avoid spicy or fatty foods.  General instructions  Wash your hands often using soap and water. If soap and water are not available, use hand sanitizer. Make sure that everyone in your household washes their hands frequently.  Take over-the-counter and prescription medicines only as told by your health care provider.  Rest at home while you recover.  Watch your condition for any changes.  Keep all follow-up visits as told by your health care provider. This is important. Contact a health care provider if:  Your vomiting gets worse.  You have new symptoms.  You have  a fever.  You cannot drink fluids without vomiting.  You feel light-headed or dizzy.  You have a headache.  You have muscle cramps.  You have a rash.  You have pain while urinating. Get help right away if:  You have pain in your chest, neck, arm, or jaw.  You feel extremely weak or you faint.  You have persistent vomiting.  You have vomit that is bright red or looks like black coffee grounds.  You have stools that are bloody or black, or stools that look like tar.  You have a severe headache, a stiff neck, or both.  You have severe pain, cramping, or bloating in your abdomen.  You have trouble breathing or you are breathing very quickly.  Your heart is beating very quickly.  Your skin feels cold and clammy.  You feel confused.  You have signs of dehydration, such as: ? Dark urine, very little urine, or no urine. ? Cracked lips. ? Dry mouth. ? Sunken eyes. ? Sleepiness. ? Weakness. These symptoms may represent a serious problem that is an emergency. Do not wait to see if the symptoms will go away. Get medical help right away. Call your local emergency services (911 in the [place] Do not drive yourself to the hospital. Summary  Vomiting occurs when stomach contents are thrown up and out of the mouth. Vomiting can cause you to become dehydrated. Older adults and people who have other diseases or a weak immune system are at higher risk for dehydration.  It is important to treat vomiting as told by your  health care provider. Follow your health care provider's instructions about eating and drinking.  Wash your hands often using soap and water. If soap and water are not available, use hand sanitizer. Make sure that everyone in your household washes their hands frequently.  Watch your condition for any changes and for signs of dehydration.  Keep all follow-up visits as told by your health care provider. This is important. This information is not intended to replace  advice given to you by your health care provider. Make sure you discuss any questions you have with your health care provider. Document Released: 12/05/2015 Document Revised: 04/18/2018 Document Reviewed: 04/18/2018 Elsevier Patient Education  2020 [employer].

## 2019-09-05 NOTE — Assessment & Plan Note (Signed)
Uncontrolled Asymptomatic with no red flag signs Continue Maxide 25 Increase amlodipine to 10 mg daily Recheck metabolic panel Follow-up in 1 month

## 2019-09-05 NOTE — Assessment & Plan Note (Signed)
Patient with postprandial vomiting intermittently over the last couple of years Exam is benign today Discussed possible differential diagnosis, including gastroparesis, cyclical vomiting, GERD, gallbladder pathology, pancreatitis We will check CMP, CBC, lipase Trial of omeprazole Pending lab results, may consider right upper quadrant ultrasound Follow-up in 1 month with PCP Could also consider GI referral

## 2019-09-05 NOTE — Assessment & Plan Note (Signed)
3 to 5-minute discussion regarding the harms of continuing to smoke and the benefits of cessation Patient will try to cut back on his smoking and may consider nicotine replacement therapy Could consider Chantix or Wellbutrin in the future

## 2019-09-05 NOTE — Assessment & Plan Note (Signed)
Reassurance given to patient that this is benign

## 2019-09-05 NOTE — Progress Notes (Signed)
Patient: Arthur Bradley Male    DOB: 1975/12/05   43 y.o.   MRN: [id number] Visit Date: 09/05/2019  Today's Provider: Shirlee LatchAngela Brittnay Pigman, MD   Chief Complaint  Patient presents with  . Emesis   Subjective:     HPI Patient here today c/o vomiting after eat or drinking, reports this has been going on for about a year. Patient reports symptoms come and go, sometimes he will vomit daily for 2-3 days or vomit 2-3 times a day. Patient reports he does smoke and un healthy diet. Patient reports he some chest congestion. Patient reports he has not taken any medications for symptoms. Patient reports he vomited on Sunday causing him to have some redness in his right eye.   Never had a workup for this.  Abdominal pain only occurs after vomiting.  Emesis is NBNB.  Does not seem to matter what he has eaten.  Drinks 1-2 beers per evening.  Previous heavy drinker.  No alcohol in last 2-3 days.  No marijuana or other drug use. No NSAIDs.  No difficulty swallowing.  No regurgitation of food.  Wants to quit smoking.  Tried to quit many years ago. Smokes 1/2 PPD x25 years  HTN: - Medications: amlodipine 5mg , Maxzide 25 daily - Compliance: good - Checking BP at home: no - Denies any SOB, CP, vision changes, LE edema, medication SEs, or symptoms of hypotension - Diet: unhealthy - Exercise: not regular    No Known Allergies   Current Outpatient Medications:  .  amLODipine (NORVASC) 5 MG tablet, TAKE 1 TABLET BY MOUTH EVERY DAY, Disp: 90 tablet, Rfl: 3 .  triamterene-hydrochlorothiazide (MAXZIDE-25) 37.5-25 MG tablet, TAKE 1 TABLET BY MOUTH EVERY DAY, Disp: 90 tablet, Rfl: 4  Review of Systems  Constitutional: Negative.   HENT: Positive for congestion. Negative for dental problem, drooling, ear discharge, ear pain, facial swelling, hearing loss, mouth sores, nosebleeds, postnasal drip, rhinorrhea, sinus pressure, sinus pain, sneezing, sore throat, tinnitus, trouble swallowing and voice change.    Eyes: Positive for redness. Negative for photophobia, pain, discharge, itching and visual disturbance.  Respiratory: Negative.   Cardiovascular: Negative.   Gastrointestinal: Positive for nausea and vomiting. Negative for abdominal distention, abdominal pain, blood in stool, constipation and diarrhea.  Genitourinary: Negative.   Neurological: Negative.   Psychiatric/Behavioral: Negative.     Social History   Tobacco Use  . Smoking status: Current Every Day Smoker    Years: 0.00    Types: E-cigarettes  . Smokeless tobacco: Never Used  Substance Use Topics  . Alcohol use: Yes    Comment: Rarely      Objective:   BP (!) 180/143 (BP Location: Left Arm, Patient Position: Sitting, Cuff Size: Large)   Pulse 99   Temp (!) 97.3 F (36.3 C) (Temporal)   Resp 16   Ht 5\' 7"  (1.702 m)   Wt 219 lb 6.4 oz (99.5 kg)   SpO2 97%   BMI 34.36 kg/m  Vitals:   09/05/19 1051 09/05/19 1053  BP: (!) 189/150 (!) 180/143  Pulse: 94 99  Resp: 16   Temp: (!) 97.3 F (36.3 C)   TempSrc: Temporal   SpO2: 97%   Weight: 219 lb 6.4 oz (99.5 kg)   Height: 5\' 7"  (1.702 m)   Body mass index is 34.36 kg/m.   Physical Exam Vitals signs reviewed.  Constitutional:      General: He is not in acute distress.    Appearance: Normal appearance.  He is not diaphoretic.  HENT:     Head: Normocephalic and atraumatic.  Eyes:     General: No scleral icterus.    Comments: Subconjunctival hemorrhage of right lateral eye  Neck:     Musculoskeletal: Neck supple.  Cardiovascular:     Rate and Rhythm: Normal rate and regular rhythm.     Pulses: Normal pulses.     Heart sounds: Normal heart sounds. No murmur.  Pulmonary:     Effort: Pulmonary effort is normal. No respiratory distress.     Breath sounds: Normal breath sounds. No wheezing.  Abdominal:     General: Bowel sounds are normal. There is no distension.     Palpations: Abdomen is soft.     Tenderness: There is no abdominal tenderness. There is no  guarding or rebound. Negative signs include Murphy's sign.  Musculoskeletal:     Right lower leg: No edema.     Left lower leg: No edema.  Lymphadenopathy:     Cervical: No cervical adenopathy.  Skin:    General: Skin is warm and dry.     Capillary Refill: Capillary refill takes less than 2 seconds.     Findings: No rash.  Neurological:     Mental Status: He is alert and oriented to person, place, and time. Mental status is at baseline.  Psychiatric:        Mood and Affect: Mood normal.        Behavior: Behavior normal.      No results found for any visits on 09/05/19.     Assessment & Plan   Problem List Items Addressed This Visit      Cardiovascular and Mediastinum   Essential hypertension    Uncontrolled Asymptomatic with no red flag signs Continue Maxide 25 Increase amlodipine to 10 mg daily Recheck metabolic panel Follow-up in 1 month      Relevant Medications   amLODipine (NORVASC) 10 MG tablet   Other Relevant Orders   Comprehensive metabolic panel     Digestive   Non-intractable vomiting - Primary    Patient with postprandial vomiting intermittently over the last couple of years Exam is benign today Discussed possible differential diagnosis, including gastroparesis, cyclical vomiting, GERD, gallbladder pathology, pancreatitis We will check CMP, CBC, lipase Trial of omeprazole Pending lab results, may consider right upper quadrant ultrasound Follow-up in 1 month with PCP Could also consider GI referral      Relevant Orders   CBC   Comprehensive metabolic panel   Lipase   H Pylori, IGM, IGG, IGA AB     Other   Hypercholesteremia    Not currently on statin Recheck fasting lipid panel and CMP      Relevant Medications   amLODipine (NORVASC) 10 MG tablet   Other Relevant Orders   Comprehensive metabolic panel   Lipid panel   Current tobacco use    3 to 5-minute discussion regarding the harms of continuing to smoke and the benefits of cessation  Patient will try to cut back on his smoking and may consider nicotine replacement therapy Could consider Chantix or Wellbutrin in the future      Subconjunctival hemorrhage of right eye    Reassurance given to patient that this is benign          Return in about 4 weeks (around 10/03/2019) for chronic disease f/u.   The entirety of the information documented in the History of Present Illness, Review of Systems and Physical Exam were personally obtained  by me. Portions of this information were initially documented by Lynford Humphrey, CMA and reviewed by me for thoroughness and accuracy.    Beatriz Quintela, Dionne Bucy, MD MPH [facility]

## 2019-09-05 NOTE — Assessment & Plan Note (Signed)
Not currently on statin Recheck fasting lipid panel and CMP

## 2019-09-06 ENCOUNTER — Telehealth: Payer: Self-pay | Admitting: Family Medicine

## 2019-09-06 NOTE — Telephone Encounter (Signed)
Pt called asking if Dr. Caryn Section thinks he needs to be put on light duty because of his lab results and kidneys.  CB#  [phone]  teri

## 2019-09-07 LAB — CBC
Hematocrit: 46.5 % (ref 37.5–51.0)
Hemoglobin: 15.9 g/dL (ref 13.0–17.7)
MCH: 30.3 pg (ref 26.6–33.0)
MCHC: 34.2 g/dL (ref 31.5–35.7)
MCV: 89 fL (ref 79–97)
Platelets: 176 10*3/uL (ref 150–450)
RBC: 5.24 x10E6/uL (ref 4.14–5.80)
RDW: 14.2 % (ref 11.6–15.4)
WBC: 4.1 10*3/uL (ref 3.4–10.8)

## 2019-09-07 LAB — COMPREHENSIVE METABOLIC PANEL WITH GFR
ALT: 49 [IU]/L — ABNORMAL HIGH (ref 0–44)
AST: 39 [IU]/L (ref 0–40)
Albumin/Globulin Ratio: 1.8 (ref 1.2–2.2)
Albumin: 5.1 g/dL — ABNORMAL HIGH (ref 4.0–5.0)
Alkaline Phosphatase: 68 [IU]/L (ref 39–117)
BUN/Creatinine Ratio: 11 (ref 9–20)
BUN: 19 mg/dL (ref 6–24)
Bilirubin Total: 0.7 mg/dL (ref 0.0–1.2)
CO2: 24 mmol/L (ref 20–29)
Calcium: 10.1 mg/dL (ref 8.7–10.2)
Chloride: 96 mmol/L (ref 96–106)
Creatinine, Ser: 1.66 mg/dL — ABNORMAL HIGH (ref 0.76–1.27)
GFR calc Af Amer: 57 mL/min/{1.73_m2} — ABNORMAL LOW
GFR calc non Af Amer: 50 mL/min/{1.73_m2} — ABNORMAL LOW
Globulin, Total: 2.8 g/dL (ref 1.5–4.5)
Glucose: 105 mg/dL — ABNORMAL HIGH (ref 65–99)
Potassium: 3.4 mmol/L — ABNORMAL LOW (ref 3.5–5.2)
Sodium: 137 mmol/L (ref 134–144)
Total Protein: 7.9 g/dL (ref 6.0–8.5)

## 2019-09-07 LAB — LIPID PANEL
Chol/HDL Ratio: 4.7 ratio (ref 0.0–5.0)
Cholesterol, Total: 207 mg/dL — ABNORMAL HIGH (ref 100–199)
HDL: 44 mg/dL
LDL Chol Calc (NIH): 120 mg/dL — ABNORMAL HIGH (ref 0–99)
Triglycerides: 246 mg/dL — ABNORMAL HIGH (ref 0–149)
VLDL Cholesterol Cal: 43 mg/dL — ABNORMAL HIGH (ref 5–40)

## 2019-09-07 LAB — LIPASE: Lipase: 226 U/L — ABNORMAL HIGH (ref 13–78)

## 2019-09-07 LAB — H PYLORI, IGM, IGG, IGA AB
H pylori, IgM Abs: <9 units (ref 0.0–8.9)
H. pylori, IgA Abs: <9 units (ref 0.0–8.9)
H. pylori, IgG AbS: 0.4 Index Value (ref 0.00–0.79)

## 2019-09-07 NOTE — Telephone Encounter (Signed)
Please review report and advise. Kw

## 2019-10-09 ENCOUNTER — Telehealth: Payer: Self-pay

## 2019-10-09 NOTE — Telephone Encounter (Signed)
  Source Subject Topic  Ruedas, Arthur Bradley (Patient) Sangha, Arthur Bradley (Patient) General - Call Back - No Documentation  Reason for CRM: Pt stated he had a missed call from the office so he was just returning the call. Pt requests call back    

## 2019-10-09 NOTE — Telephone Encounter (Signed)
Left patient a message. No documentation of anyone in the office contacting patient. It may have been a appointment reminder for 11/20

## 2019-10-12 ENCOUNTER — Other Ambulatory Visit: Payer: Self-pay

## 2019-10-12 ENCOUNTER — Ambulatory Visit: Payer: No Typology Code available for payment source | Admitting: Family Medicine

## 2019-10-12 ENCOUNTER — Encounter: Payer: Self-pay | Admitting: Family Medicine

## 2019-10-12 VITALS — BP 146/97 | HR 90 | Temp 97.1°F | Wt 222.6 lb

## 2019-10-12 DIAGNOSIS — R1011 Right upper quadrant pain: Secondary | ICD-10-CM | POA: Diagnosis not present

## 2019-10-12 DIAGNOSIS — I10 Essential (primary) hypertension: Secondary | ICD-10-CM | POA: Diagnosis not present

## 2019-10-12 DIAGNOSIS — K859 Acute pancreatitis without necrosis or infection, unspecified: Secondary | ICD-10-CM

## 2019-10-12 NOTE — Progress Notes (Signed)
Patient: Arthur Bradley Male    DOB: [birth year]   43 y.o.   MRN: [id number] Visit Date: 10/12/2019  Today's Provider: Mila Merry, MD   Chief Complaint  Patient presents with  . Hypertension  . Emesis   Subjective:     HPI  Hypertension, follow-up:  BP Readings from Last 3 Encounters:  10/12/19 (!) 155/96  09/05/19 (!) 180/143  06/12/18 (!) 160/110    He was last seen for hypertension 1 months ago (seen by Dr. Beryle Flock).  BP at that visit was 180/14.. Management since that visit includes increasing Amlodipine to 10mg  daily. Patient was advised to continue taking Maxide. He reports good compliance with treatment. He is not having side effects.  He is exercising. He is adherent to low salt diet.   Outside blood pressures have not been checked this week. He is experiencing none.  Patient denies chest pain, chest pressure/discomfort, irregular heart beat and palpitations.   Cardiovascular risk factors include dyslipidemia, hypertension, male gender and obesity (BMI >= 30 kg/m2).  Use of agents associated with hypertension: none.     Weight trend: stable Wt Readings from Last 3 Encounters:  10/12/19 222 lb 9.6 oz (101 kg)  09/05/19 219 lb 6.4 oz (99.5 kg)  06/12/18 236 lb (107 kg)    Current diet: in general, a "healthy" diet    ------------------------------------------------------------------------  Follow up for Vomiting:  The patient was last seen for this 1 months ago. Changes made at last visit include trial of Omeprazole.  He reports good compliance with treatment. He feels that condition is Improved. He is not having side effects.  He was found to have elevated lipase of 226 He has since cut way back on alcohol consumption. He was drinking 2-4 beers every day in addition to liqueur, but now only drinking once or twice a week.   He was having some RUQ discomfort before, but states this has resolved.   ------------------------------------------------------------------------------------  No Known Allergies   Current Outpatient Medications:  .  amLODipine (NORVASC) 10 MG tablet, Take 1 tablet (10 mg total) by mouth daily., Disp: 30 tablet, Rfl: 3 .  omeprazole (PRILOSEC) 20 MG capsule, Take 1 capsule (20 mg total) by mouth daily., Disp: 30 capsule, Rfl: 3 .  triamterene-hydrochlorothiazide (MAXZIDE-25) 37.5-25 MG tablet, TAKE 1 TABLET BY MOUTH EVERY DAY, Disp: 90 tablet, Rfl: 4  Review of Systems  Constitutional: Negative for appetite change, chills and fever.  Respiratory: Negative for chest tightness, shortness of breath and wheezing.   Cardiovascular: Negative for chest pain and palpitations.  Gastrointestinal: Negative for abdominal pain, nausea and vomiting.    Social History   Tobacco Use  . Smoking status: Current Every Day Smoker    Years: 0.00    Types: E-cigarettes  . Smokeless tobacco: Never Used  Substance Use Topics  . Alcohol use: Yes    Comment: Rarely      Objective:   BP (!) 155/96 (BP Location: Right Arm, Patient Position: Sitting, Cuff Size: Large)   Pulse 90   Temp (!) 97.1 F (36.2 C) (Temporal)   Wt 222 lb 9.6 oz (101 kg)   BMI 34.86 kg/m  Vitals:   10/12/19 1557  BP: (!) 155/96  Pulse: 90  Temp: (!) 97.1 F (36.2 C)  TempSrc: Temporal  Weight: 222 lb 9.6 oz (101 kg)  Body mass index is 34.86 kg/m.   Physical Exam   General appearance: Obese male, cooperative and in no acute  distress Head: Normocephalic, without obvious abnormality, atraumatic Respiratory: Respirations even and unlabored, normal respiratory rate Extremities: All extremities are intact.  Skin: Skin color, texture, turgor normal. No rashes seen  Psych: Appropriate mood and affect. Neurologic: Mental status: Alert, oriented to person, place, and time, thought content appropriate.      Assessment & Plan    1. Acute pancreatitis, unspecified complication status,  unspecified pancreatitis type Symptoms have mostly resolved since cutting back alcohol consumption. Had mildly elevated triglycerides. Advised will need [place] if pancreatic enzymes have not significantly improved..   - Comprehensive metabolic panel - Lipase - CBC  2. Essential hypertension Still elevated, but much better since increasing amlodipine and taking medications consistently. Follow up in 2 months  3. RUQ pain Resolved since reducing alcohol consumption.     The entirety of the information documented in the History of Present Illness, Review of Systems and Physical Exam were personally obtained by me. Portions of this information were initially documented by Idelle Jo, CMA and reviewed by me for thoroughness and accuracy.     Lelon Huh, MD  [facility]

## 2019-10-18 LAB — CBC
Hematocrit: 40.4 % (ref 37.5–51.0)
Hemoglobin: 13.8 g/dL (ref 13.0–17.7)
MCH: 30.3 pg (ref 26.6–33.0)
MCHC: 34.2 g/dL (ref 31.5–35.7)
MCV: 89 fL (ref 79–97)
Platelets: 233 10*3/uL (ref 150–450)
RBC: 4.55 x10E6/uL (ref 4.14–5.80)
RDW: 14.1 % (ref 11.6–15.4)
WBC: 4.4 10*3/uL (ref 3.4–10.8)

## 2019-10-18 LAB — COMPREHENSIVE METABOLIC PANEL
ALT: 33 IU/L (ref 0–44)
AST: 33 IU/L (ref 0–40)
Albumin/Globulin Ratio: 2 (ref 1.2–2.2)
Albumin: 4.8 g/dL (ref 4.0–5.0)
Alkaline Phosphatase: 58 IU/L (ref 39–117)
BUN/Creatinine Ratio: 13 (ref 9–20)
BUN: 20 mg/dL (ref 6–24)
Bilirubin Total: <0.2 mg/dL (ref 0.0–1.2)
CO2: 22 mmol/L (ref 20–29)
Calcium: 10.1 mg/dL (ref 8.7–10.2)
Chloride: 102 mmol/L (ref 96–106)
Creatinine, Ser: 1.55 mg/dL — ABNORMAL HIGH (ref 0.76–1.27)
GFR calc Af Amer: 62 mL/min/{1.73_m2} (ref >59)
GFR calc non Af Amer: 54 mL/min/{1.73_m2} — ABNORMAL LOW (ref >59)
Globulin, Total: 2.4 g/dL (ref 1.5–4.5)
Glucose: 95 mg/dL (ref 65–99)
Potassium: 4.2 mmol/L (ref 3.5–5.2)
Sodium: 143 mmol/L (ref 134–144)
Total Protein: 7.2 g/dL (ref 6.0–8.5)

## 2019-10-18 LAB — LIPASE: Lipase: 56 U/L (ref 13–78)

## 2019-10-19 ENCOUNTER — Telehealth: Payer: Self-pay

## 2019-10-19 NOTE — Telephone Encounter (Signed)
-----   Message from Birdie Sons, MD sent at 10/18/2019  9:59 AM EST ----- Pancreatic tests are completely back to normal. All other labs Continue current medications. Please schedule follow up in 2 months for bp check

## 2019-10-19 NOTE — Telephone Encounter (Signed)
Returned call to pt. To give lab results.  Advised of result note per Dr. Caryn Section from 10/18/19; verb. Understanding.  Scheduled 2 mo. F/u BP check appt. 12/17/19 @ 8:00 AM.  Pt. Agreed with plan.

## 2019-10-19 NOTE — Telephone Encounter (Signed)
LMTCB OK for PEC to give results and to schedule follow up appt.

## 2019-10-19 NOTE — Telephone Encounter (Signed)
Pt calling back for results. Nurse unavailable. Please call back to give results.    [phone]

## 2019-11-27 ENCOUNTER — Other Ambulatory Visit: Payer: Self-pay | Admitting: Family Medicine

## 2019-11-28 ENCOUNTER — Other Ambulatory Visit: Payer: Self-pay | Admitting: Family Medicine

## 2019-11-28 DIAGNOSIS — I10 Essential (primary) hypertension: Secondary | ICD-10-CM

## 2019-12-17 ENCOUNTER — Ambulatory Visit: Payer: Self-pay | Admitting: Family Medicine

## 2020-02-21 ENCOUNTER — Other Ambulatory Visit: Payer: Self-pay | Admitting: Family Medicine

## 2020-02-21 DIAGNOSIS — I10 Essential (primary) hypertension: Secondary | ICD-10-CM

## 2020-04-05 ENCOUNTER — Other Ambulatory Visit: Payer: Self-pay | Admitting: Family Medicine

## 2020-04-05 DIAGNOSIS — I10 Essential (primary) hypertension: Secondary | ICD-10-CM

## 2020-04-05 NOTE — Telephone Encounter (Signed)
Requested Prescriptions  Pending Prescriptions Disp Refills  . triamterene-hydrochlorothiazide (MAXZIDE-25) 37.5-25 MG tablet [Pharmacy Med Name: TRIAMTERENE-HCTZ 37.5-25 MG TB] 90 tablet 0    Sig: TAKE 1 TABLET BY MOUTH EVERY DAY     Cardiovascular: Diuretic Combos Failed - 04/05/2020 10:10 AM      Failed - Cr in normal range and within 360 days    Creatinine, Ser  Date Value Ref Range Status  10/17/2019 1.55 (H) 0.76 - 1.27 mg/dL Final         Failed - Last BP in normal range    BP Readings from Last 1 Encounters:  10/12/19 (!) 146/97         Passed - K in normal range and within 360 days    Potassium  Date Value Ref Range Status  10/17/2019 4.2 3.5 - 5.2 mmol/L Final         Passed - Na in normal range and within 360 days    Sodium  Date Value Ref Range Status  10/17/2019 143 134 - 144 mmol/L Final         Passed - Ca in normal range and within 360 days    Calcium  Date Value Ref Range Status  10/17/2019 10.1 8.7 - 10.2 mg/dL Final         Passed - Valid encounter within last 6 months    Recent Outpatient Visits          5 months ago Acute pancreatitis, unspecified complication status, unspecified pancreatitis type   [facility] Malva Limes, MD   7 months ago Non-intractable vomiting with nausea, unspecified vomiting type   [facility], Marzella Schlein, MD   1 year ago Essential hypertension   [facility] Malva Limes, MD   3 years ago Drug-induced erectile dysfunction   [facility] Lorie Phenix, MD   4 years ago Essential hypertension, benign   [facility] Lorie Phenix, MD

## 2020-05-19 ENCOUNTER — Other Ambulatory Visit: Payer: Self-pay | Admitting: Family Medicine

## 2020-05-19 DIAGNOSIS — I10 Essential (primary) hypertension: Secondary | ICD-10-CM

## 2020-06-14 ENCOUNTER — Other Ambulatory Visit: Payer: Self-pay | Admitting: Family Medicine

## 2020-06-14 DIAGNOSIS — I10 Essential (primary) hypertension: Secondary | ICD-10-CM

## 2020-06-14 NOTE — Telephone Encounter (Signed)
Requested medication (s) are due for refill today: yes  Requested medication (s) are on the active medication list: yes  Last refill:  05/19/20 (courtesy RF)  Future visit scheduled: no  Notes to clinic:  called pt and LM on VM with call back number- advised pt that he needs to call back Monday and schedule appt for refills. Courtesy RF already given   Requested Prescriptions  Pending Prescriptions Disp Refills   amLODipine (NORVASC) 10 MG tablet [Pharmacy Med Name: AMLODIPINE BESYLATE 10 MG TAB] 30 tablet 0    Sig: TAKE 1 TABLET BY MOUTH EVERY DAY      Cardiovascular:  Calcium Channel Blockers Failed - 06/14/2020 11:32 AM      Failed - Last BP in normal range    BP Readings from Last 1 Encounters:  10/12/19 (!) 146/97          Failed - Valid encounter within last 6 months    Recent Outpatient Visits           8 months ago Acute pancreatitis, unspecified complication status, unspecified pancreatitis type   [facility] Malva Limes, MD   9 months ago Non-intractable vomiting with nausea, unspecified vomiting type   [facility], Marzella Schlein, MD   2 years ago Essential hypertension   [facility] Malva Limes, MD   4 years ago Drug-induced erectile dysfunction   [facility] Lorie Phenix, MD   4 years ago Essential hypertension, benign   [facility] Lorie Phenix, MD

## 2020-06-29 ENCOUNTER — Other Ambulatory Visit: Payer: Self-pay | Admitting: Family Medicine

## 2020-06-29 DIAGNOSIS — I10 Essential (primary) hypertension: Secondary | ICD-10-CM

## 2020-06-29 NOTE — Telephone Encounter (Signed)
Requested medication (s) are due for refill today: yes  Requested medication (s) are on the active medication list: yes  Last refill:  04/05/20  Future visit scheduled: no  Notes to clinic:  Called pt and LM on VM to call office and make appt for med RF   Requested Prescriptions  Pending Prescriptions Disp Refills   triamterene-hydrochlorothiazide (MAXZIDE-25) 37.5-25 MG tablet [Pharmacy Med Name: TRIAMTERENE-HCTZ 37.5-25 MG TB] 90 tablet 0    Sig: TAKE 1 TABLET BY MOUTH EVERY DAY      Cardiovascular: Diuretic Combos Failed - 06/29/2020  9:15 AM      Failed - Cr in normal range and within 360 days    Creatinine, Ser  Date Value Ref Range Status  10/17/2019 1.55 (H) 0.76 - 1.27 mg/dL Final          Failed - Last BP in normal range    BP Readings from Last 1 Encounters:  10/12/19 (!) 146/97          Failed - Valid encounter within last 6 months    Recent Outpatient Visits           8 months ago Acute pancreatitis, unspecified complication status, unspecified pancreatitis type   [facility] Malva Limes, MD   9 months ago Non-intractable vomiting with nausea, unspecified vomiting type   [facility], Marzella Schlein, MD   2 years ago Essential hypertension   [facility] Malva Limes, MD   4 years ago Drug-induced erectile dysfunction   [facility] Lorie Phenix, MD   4 years ago Essential hypertension, benign   [facility] Lorie Phenix, MD              Passed - K in normal range and within 360 days    Potassium  Date Value Ref Range Status  10/17/2019 4.2 3.5 - 5.2 mmol/L Final          Passed - Na in normal range and within 360 days    Sodium  Date Value Ref Range Status  10/17/2019 143 134 - 144 mmol/L Final          Passed - Ca in normal range and within 360 days    Calcium  Date Value Ref Range Status  10/17/2019 10.1 8.7 - 10.2 mg/dL Final

## 2020-08-08 ENCOUNTER — Other Ambulatory Visit: Payer: Self-pay

## 2020-08-08 ENCOUNTER — Encounter: Payer: Self-pay | Admitting: Family Medicine

## 2020-08-08 ENCOUNTER — Ambulatory Visit: Payer: No Typology Code available for payment source | Admitting: Family Medicine

## 2020-08-08 VITALS — BP 166/122 | HR 84 | Temp 98.0°F | Resp 16 | Ht 67.0 in | Wt 224.0 lb

## 2020-08-08 DIAGNOSIS — F5101 Primary insomnia: Secondary | ICD-10-CM

## 2020-08-08 DIAGNOSIS — I10 Essential (primary) hypertension: Secondary | ICD-10-CM | POA: Diagnosis not present

## 2020-08-08 DIAGNOSIS — Z720 Tobacco use: Secondary | ICD-10-CM | POA: Diagnosis not present

## 2020-08-08 MED ORDER — AMITRIPTYLINE HCL 10 MG PO TABS: 10.0000 mg | ORAL_TABLET | Freq: Every day | ORAL | 1 refills | Status: DC

## 2020-08-08 MED ORDER — TRIAMTERENE-HCTZ 37.5-25 MG PO TABS: 1.0000 | ORAL_TABLET | Freq: Every day | ORAL | 0 refills | Status: DC

## 2020-08-08 MED ORDER — AMLODIPINE BESYLATE 10 MG PO TABS: 10.0000 mg | ORAL_TABLET | Freq: Every day | ORAL | 0 refills | Status: DC

## 2020-08-08 MED ORDER — BUPROPION HCL ER (SR) 150 MG PO TB12: ORAL_TABLET | ORAL | 3 refills | Status: DC

## 2020-08-08 NOTE — Patient Instructions (Signed)
.   Take the bupropion for 2 weeks before you try to stop smoking   You can using medium dose of Nicotine patch with the bupropion when you stop smoking. But you cannot smoke when you are wearing a nicotine patch

## 2020-08-08 NOTE — Progress Notes (Signed)
I,April Miller,acting as a scribe for Mila Merry, MD.,have documented all relevant documentation on the behalf of Mila Merry, MD,as directed by  Mila Merry, MD while in the presence of Mila Merry, MD.   Established patient visit   Patient: Arthur Bradley   DOB: 10-29-1976   44 y.o. Male  MRN: [id number] Visit Date: 08/08/2020  Today's healthcare provider: Mila Merry, MD   Chief Complaint  Patient presents with  . Follow-up  . Eye Problem   Subjective    HPI   Hypertension, follow-up  BP Readings from Last 3 Encounters:  08/08/20 (!) 166/122  10/12/19 (!) 146/97  09/05/19 (!) 180/143   Wt Readings from Last 3 Encounters:  08/08/20 224 lb (101.6 kg)  10/12/19 222 lb 9.6 oz (101 kg)  09/05/19 219 lb 6.4 oz (99.5 kg)     He was last seen for hypertension 10 months ago.  BP at that visit was 146/97. Management since that visit includes continuing the same medication and advised to follow up in 2 months He reports fair compliance with treatment. He has only been taking medications every couple of days since running low on medications.  He is not having side effects.  He some exercising. He some adherent to low salt diet.   Outside blood pressures are not checking.  He does not smoke.  Use of agents associated with hypertension: none.   -------------------------------------------------------------------  He also states he has been having trouble sleeping mainly due to stress.  He is smoking about 1/2 ppd and would like to quit. He is interested in trying a prescription medication to help him stop smoking.      Medications: Outpatient Medications Prior to Visit  Medication Sig  . amLODipine (NORVASC) 10 MG tablet TAKE 1 TABLET BY MOUTH EVERY DAY  . omeprazole (PRILOSEC) 20 MG capsule TAKE 1 CAPSULE BY MOUTH EVERY DAY  . triamterene-hydrochlorothiazide (MAXZIDE-25) 37.5-25 MG tablet TAKE 1 TABLET BY MOUTH EVERY DAY   No facility-administered  medications prior to visit.    Review of Systems  Constitutional: Negative for appetite change and chills.  Respiratory: Negative for chest tightness, shortness of breath and wheezing.   Cardiovascular: Negative for chest pain and palpitations.  Gastrointestinal: Negative for abdominal pain.      Objective    BP (!) 166/122 (BP Location: Right Arm, Patient Position: Sitting, Cuff Size: Large)   Pulse 84   Temp 98 F (36.7 C) (Oral)   Resp 16   Ht 5\' 7"  (1.702 m)   Wt 224 lb (101.6 kg)   SpO2 98%   BMI 35.08 kg/m    Physical Exam    General: Appearance:    Mildly obese male in no acute distress  Eyes:    PERRL, conjunctiva/corneas clear, EOM's intact       Lungs:     Clear to auscultation bilaterally, respirations unlabored  Heart:    Normal heart rate. Normal rhythm. No murmurs, rubs, or gallops.   MS:   All extremities are intact.   Neurologic:   Awake, alert, oriented x 3. No apparent focal neurological           defect.        Depression screen [facility] 2/9 08/08/2020 09/05/2019 06/12/2018  Decreased Interest 1 1 0  Down, Depressed, Hopeless 0 1 0  PHQ - 2 Score 1 2 0  Altered sleeping 3 1 0  Tired, decreased energy 3 1 3   Change in appetite 1 1 2  Feeling bad or failure about yourself  0 1 0  Trouble concentrating 1 0 0  Moving slowly or fidgety/restless 2 1 0  Suicidal thoughts 0 0 0  PHQ-9 Score 11 7 5   Difficult doing work/chores Not difficult at all Not difficult at all Not difficult at all    No results found for any visits on 08/08/20.  Assessment & Plan     1. Essential hypertension Not currently taking medications consistently. Was reasonably well controlled at last visit. Will get back on both medications and return in one month at which time he will be due for labs.  - triamterene-hydrochlorothiazide (MAXZIDE-25) 37.5-25 MG tablet; Take 1 tablet by mouth daily.  Dispense: 90 tablet; Refill: 0 - amLODipine (NORVASC) 10 MG tablet; Take 1 tablet (10 mg  total) by mouth daily.  Dispense: 90 tablet; Refill: 0  2. 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 Will try low dose amitriptyline which may also help with smoking cessation.   3. Current tobacco use  - buPROPion (WELLBUTRIN SR) 150 MG 12 hr tablet; 1 tablet daily for 3 days, then 1 tablet twice daily. Stop smoking 14 days after starting medication  Dispense: 60 tablet; Refill: 3 Counseled on use of OTC nicotine replacements.    Future Appointments  Date Time Provider Department Center  10/06/2020  8:40 AM 10/08/2020, Sherrie Mustache, MD [facility]            Demetrios Isaacs, MD  [facility] [phone] (phone) [phone] (fax)  [facility]

## 2020-08-30 ENCOUNTER — Other Ambulatory Visit: Payer: Self-pay | Admitting: Family Medicine

## 2020-08-30 DIAGNOSIS — F5101 Primary insomnia: Secondary | ICD-10-CM

## 2020-08-30 DIAGNOSIS — Z720 Tobacco use: Secondary | ICD-10-CM

## 2020-10-03 NOTE — Progress Notes (Signed)
Established patient visit   Patient: Arthur Bradley   DOB: 09-04-1976   44 y.o. Male  MRN: [id number] Visit Date: 10/06/2020  Today's healthcare provider: Mila Merry, MD   Chief Complaint  Patient presents with  . Hypertension   Subjective    HPI  Hypertension, follow-up  BP Readings from Last 3 Encounters:  10/06/20 (!) 147/101  08/08/20 (!) 166/122  10/12/19 (!) 146/97   Wt Readings from Last 3 Encounters:  10/06/20 223 lb (101.2 kg)  08/08/20 224 lb (101.6 kg)  10/12/19 222 lb 9.6 oz (101 kg)     He was last seen for hypertension 2 months ago.  BP at that visit was 166/122. Management since that visit includes starting back on all medications consistently (patient was not taking medications consistently at that time).   He reports excellent compliance with treatment. He is not having side effects.  He is following a Regular diet. He is exercising. He does smoke.  Use of agents associated with hypertension: none.   Outside blood pressures are only checked occasionally. Symptoms: No chest pain No chest pressure  No palpitations No syncope  No dyspnea No orthopnea  No paroxysmal nocturnal dyspnea No lower extremity edema   Pertinent labs: Lab Results  Component Value Date   CHOL 207 (H) 09/05/2019   HDL 44 09/05/2019   LDLCALC 120 (H) 09/05/2019   TRIG 246 (H) 09/05/2019   CHOLHDL 4.7 09/05/2019   Lab Results  Component Value Date   NA 143 10/17/2019   K 4.2 10/17/2019   CREATININE 1.55 (H) 10/17/2019   GFRNONAA 54 (L) 10/17/2019   GFRAA 62 10/17/2019   GLUCOSE 95 10/17/2019     The 10-year ASCVD risk score Denman George DC Jr., et al., 2013) is: 14.7%   Follow up for insomnia  The patient was last seen for this 2 months ago. Changes made at last visit include starting low dose amitriptyline.  Pt states he is sleeping better at night and has not needed Amitriptyline.   He feels that condition is Improved.   Follow up for tobacco abuse  The  patient was last seen for this 2 months ago. Changes made at last visit include starting Wellbutrin 150MG  twice daily .  Pt has not started Wellbutrin yet since reading risk of potential side effects on label.  He feels that condition is Unchanged.        Medications: Outpatient Medications Prior to Visit  Medication Sig  . amLODipine (NORVASC) 10 MG tablet Take 1 tablet (10 mg total) by mouth daily.  triamterene-hydrochlorothiazide (MAXZIDE-25) 37.5-25 MG tablet Take 1 tablet by mouth daily.  08-[birth year]1992 amitriptyline (ELAVIL) 10 MG tablet Take 1-2 tablets (10-20 mg total) by mouth at bedtime. (Patient not taking: Reported on 10/06/2020)  . buPROPion (WELLBUTRIN SR) 150 MG 12 hr tablet 1 tablet daily for 3 days, then 1 tablet twice daily. Stop smoking 14 days after starting medication (Patient not taking: Reported on 10/06/2020)   No facility-administered medications prior to visit.    Review of Systems  Constitutional: Negative.   Respiratory: Negative.   Cardiovascular: Negative.   Gastrointestinal: Negative.   Neurological: Negative for dizziness, light-headedness and headaches.      Objective    BP (!) 147/101 (BP Location: Left Arm, Patient Position: Sitting, Cuff Size: Large)   Pulse 93   Temp 98.5 F (36.9 C) (Oral)   Ht 5\' 9"  (1.753 m)   Wt 223 lb (101.2 kg)  BMI 32.93 kg/m    Physical Exam  General appearance: Mildly obese male, cooperative and in no acute distress Head: Normocephalic, without obvious abnormality, atraumatic Respiratory: Respirations even and unlabored, normal respiratory rate Extremities: All extremities are intact.  Skin: Skin color, texture, turgor normal. No rashes seen  Psych: Appropriate mood and affect. Neurologic: Mental status: Alert, oriented to person, place, and time, thought content appropriate.   No results found for any visits on 10/06/20.  Assessment & Plan     1. Essential hypertension Much better since he is now taking  medications consistently, but not to goal. Will either increase triamterene/hctz or change to ARB/HCTZ or spironolactone after reviewing labs. Encourage regular exercise and avoidance of sodium in diet.  - CBC - Comprehensive metabolic panel - Lipid panel    He declined flu vaccine today.      The entirety of the information documented in the History of Present Illness, Review of Systems and Physical Exam were personally obtained by me. Portions of this information were initially documented by the CMA and reviewed by me for thoroughness and accuracy.      Mila Merry, MD  [facility] [phone] (phone) [phone] (fax)  [facility]

## 2020-10-06 ENCOUNTER — Other Ambulatory Visit: Payer: Self-pay

## 2020-10-06 ENCOUNTER — Ambulatory Visit: Payer: No Typology Code available for payment source | Admitting: Family Medicine

## 2020-10-06 VITALS — BP 147/101 | HR 93 | Temp 98.5°F | Ht 69.0 in | Wt 223.0 lb

## 2020-10-06 DIAGNOSIS — I10 Essential (primary) hypertension: Secondary | ICD-10-CM

## 2020-10-07 ENCOUNTER — Other Ambulatory Visit: Payer: Self-pay | Admitting: Family Medicine

## 2020-10-07 ENCOUNTER — Telehealth: Payer: Self-pay

## 2020-10-07 DIAGNOSIS — I10 Essential (primary) hypertension: Secondary | ICD-10-CM

## 2020-10-07 LAB — COMPREHENSIVE METABOLIC PANEL
ALT: 37 IU/L (ref 0–44)
AST: 31 IU/L (ref 0–40)
Albumin/Globulin Ratio: 1.9 (ref 1.2–2.2)
Albumin: 5.2 g/dL — ABNORMAL HIGH (ref 4.0–5.0)
Alkaline Phosphatase: 72 IU/L (ref 44–121)
BUN/Creatinine Ratio: 10 (ref 9–20)
BUN: 18 mg/dL (ref 6–24)
Bilirubin Total: 0.4 mg/dL (ref 0.0–1.2)
CO2: 25 mmol/L (ref 20–29)
Calcium: 10.2 mg/dL (ref 8.7–10.2)
Chloride: 97 mmol/L (ref 96–106)
Creatinine, Ser: 1.75 mg/dL — ABNORMAL HIGH (ref 0.76–1.27)
GFR calc Af Amer: 54 mL/min/{1.73_m2} — ABNORMAL LOW (ref >59)
GFR calc non Af Amer: 46 mL/min/{1.73_m2} — ABNORMAL LOW (ref >59)
Globulin, Total: 2.7 g/dL (ref 1.5–4.5)
Glucose: 106 mg/dL — ABNORMAL HIGH (ref 65–99)
Potassium: 4 mmol/L (ref 3.5–5.2)
Sodium: 137 mmol/L (ref 134–144)
Total Protein: 7.9 g/dL (ref 6.0–8.5)

## 2020-10-07 LAB — LIPID PANEL
Chol/HDL Ratio: 5.3 ratio — ABNORMAL HIGH (ref 0.0–5.0)
Cholesterol, Total: 247 mg/dL — ABNORMAL HIGH (ref 100–199)
HDL: 47 mg/dL (ref >39)
LDL Chol Calc (NIH): 172 mg/dL — ABNORMAL HIGH (ref 0–99)
Triglycerides: 155 mg/dL — ABNORMAL HIGH (ref 0–149)
VLDL Cholesterol Cal: 28 mg/dL (ref 5–40)

## 2020-10-07 LAB — CBC
Hematocrit: 46.8 % (ref 37.5–51.0)
Hemoglobin: 15.9 g/dL (ref 13.0–17.7)
MCH: 30.3 pg (ref 26.6–33.0)
MCHC: 34 g/dL (ref 31.5–35.7)
MCV: 89 fL (ref 79–97)
Platelets: 226 10*3/uL (ref 150–450)
RBC: 5.24 x10E6/uL (ref 4.14–5.80)
RDW: 14.3 % (ref 11.6–15.4)
WBC: 5 10*3/uL (ref 3.4–10.8)

## 2020-10-07 NOTE — Telephone Encounter (Addendum)
Tried calling patient. Left message to call back. OK for [facility] triage to advise of results, then route message back to the office with patient's response.

## 2020-10-07 NOTE — Telephone Encounter (Signed)
-----   Message from Malva Limes, MD sent at 10/07/2020  8:07 AM EST ----- Kidney functions have decline a bit. Recommend he STOP triamterene/hctz and START valsartan 160/12.5 one tablet daily, #30, rf x 1. This is better for the kidneys and will probably get his blood pressure down more. Also need to drink more water Continue current dose of amlodipine.  Need to recheck kidney functions in 3-4 weeks (the second week of December) Have entered future order. Will call and remind him if he forgets.  Also cholesterol has gone up to 247, needs to cut back on saturated fasts such as red meat and pork, and concentrated sweets.

## 2020-10-08 MED ORDER — VALSARTAN-HYDROCHLOROTHIAZIDE 160-12.5 MG PO TABS: 1.0000 | ORAL_TABLET | Freq: Every day | ORAL | 1 refills | Status: DC

## 2020-10-08 NOTE — Telephone Encounter (Signed)
Esther Hardy, RN  10/08/2020 12:49 PM EST Back to Top    Pt. Given results and instructions. Verbalizes understanding.   Claretha Cooper, RN  10/08/2020 10:24 AM EST     Called

## 2020-10-08 NOTE — Telephone Encounter (Signed)
Prescription sent into pharmacy

## 2020-10-20 ENCOUNTER — Telehealth: Payer: Self-pay | Admitting: Family Medicine

## 2020-10-20 NOTE — Telephone Encounter (Signed)
Please see if patient if patient has changed triamterine to valsartan-hctz that was sent to pharmacy earlier this month. If so then is time to check renal panel to make sure electrolytes are stable with medication change. Thanks.

## 2020-10-22 NOTE — Telephone Encounter (Signed)
Patient advised. He says he did make the medication change and agrees to have labs done one day next week.

## 2020-10-27 ENCOUNTER — Other Ambulatory Visit: Payer: Self-pay

## 2020-10-27 DIAGNOSIS — I10 Essential (primary) hypertension: Secondary | ICD-10-CM

## 2020-10-28 LAB — RENAL FUNCTION PANEL
Albumin: 5 g/dL (ref 4.0–5.0)
BUN/Creatinine Ratio: 8 — ABNORMAL LOW (ref 9–20)
BUN: 14 mg/dL (ref 6–24)
CO2: 22 mmol/L (ref 20–29)
Calcium: 9.7 mg/dL (ref 8.7–10.2)
Chloride: 98 mmol/L (ref 96–106)
Creatinine, Ser: 1.67 mg/dL — ABNORMAL HIGH (ref 0.76–1.27)
GFR calc Af Amer: 57 mL/min/{1.73_m2} — ABNORMAL LOW (ref >59)
GFR calc non Af Amer: 49 mL/min/{1.73_m2} — ABNORMAL LOW (ref >59)
Glucose: 90 mg/dL (ref 65–99)
Phosphorus: 3.7 mg/dL (ref 2.8–4.1)
Potassium: 4.2 mmol/L (ref 3.5–5.2)
Sodium: 138 mmol/L (ref 134–144)

## 2020-10-30 ENCOUNTER — Other Ambulatory Visit: Payer: Self-pay | Admitting: Family Medicine

## 2020-10-30 DIAGNOSIS — I10 Essential (primary) hypertension: Secondary | ICD-10-CM

## 2020-10-31 ENCOUNTER — Other Ambulatory Visit: Payer: Self-pay | Admitting: Family Medicine

## 2020-10-31 DIAGNOSIS — I10 Essential (primary) hypertension: Secondary | ICD-10-CM

## 2020-12-01 ENCOUNTER — Ambulatory Visit: Payer: Self-pay | Admitting: Family Medicine

## 2020-12-01 NOTE — Progress Notes (Deleted)
      Established patient visit   Patient: Arthur Bradley   DOB: 03-21-1976   45 y.o. Male  MRN: [id number] Visit Date: 12/01/2020  Today's healthcare provider: Mila Merry, MD   No chief complaint on file.  Subjective    HPI  Hypertension, follow-up  BP Readings from Last 3 Encounters:  10/06/20 (!) 147/101  08/08/20 (!) 166/122  10/12/19 (!) 146/97   Wt Readings from Last 3 Encounters:  10/06/20 223 lb (101.2 kg)  08/08/20 224 lb (101.6 kg)  10/12/19 222 lb 9.6 oz (101 kg)     He was last seen for hypertension 10/06/2020.   BP at that visit was 147/101. Management since that visit includes changing from triamterine to valsartan-hctz.  He reports {excellent/good/fair/poor:19665} compliance with treatment. He {is/is not:9024} having side effects. {document side effects if present:1} He is following a {diet:21022986} diet. He {is/is not:9024} exercising. He {does/does not:200015} smoke.  Use of agents associated with hypertension: {bp agents assoc with hypertension:511::"none"}.   Outside blood pressures are {***enter patient reported home BP readings, or 'not being checked':1}. Symptoms: {Yes/No:20286} chest pain {Yes/No:20286} chest pressure  {Yes/No:20286} palpitations {Yes/No:20286} syncope  {Yes/No:20286} dyspnea {Yes/No:20286} orthopnea  {Yes/No:20286} paroxysmal nocturnal dyspnea {Yes/No:20286} lower extremity edema   Pertinent labs: Lab Results  Component Value Date   CHOL 247 (H) 10/06/2020   HDL 47 10/06/2020   LDLCALC 172 (H) 10/06/2020   TRIG 155 (H) 10/06/2020   CHOLHDL 5.3 (H) 10/06/2020   Lab Results  Component Value Date   NA 138 10/27/2020   K 4.2 10/27/2020   CREATININE 1.67 (H) 10/27/2020   GFRNONAA 49 (L) 10/27/2020   GFRAA 57 (L) 10/27/2020   GLUCOSE 90 10/27/2020     The 10-year ASCVD risk score Denman George DC Jr., et al., 2013) is: 15.1%    ---------------------------------------------------------------------------------------------------  {Show patient history (optional):23778::" "}   Medications: Outpatient Medications Prior to Visit  Medication Sig  . amLODipine (NORVASC) 10 MG tablet TAKE 1 TABLET BY MOUTH EVERY DAY  . valsartan-hydrochlorothiazide (DIOVAN-HCT) 160-12.5 MG tablet TAKE 1 TABLET BY MOUTH EVERY DAY   No facility-administered medications prior to visit.    Review of Systems  {Heme  Chem  Endocrine  Serology  Results Review (optional):23779::" "}  Objective    There were no vitals taken for this visit. {Show previous vital signs (optional):23777::" "}  Physical Exam  ***  No results found for any visits on 12/01/20.  Assessment & Plan     ***  No follow-ups on file.      {provider attestation***:1}   Mila Merry, MD  [facility] [phone] (phone) [phone] (fax)  [facility]

## 2020-12-17 ENCOUNTER — Encounter: Payer: Self-pay | Admitting: Family Medicine

## 2020-12-17 ENCOUNTER — Other Ambulatory Visit: Payer: Self-pay

## 2020-12-17 ENCOUNTER — Ambulatory Visit: Payer: No Typology Code available for payment source | Admitting: Family Medicine

## 2020-12-17 VITALS — BP 156/102 | HR 78 | Temp 98.6°F | Ht 68.0 in | Wt 235.4 lb

## 2020-12-17 DIAGNOSIS — I10 Essential (primary) hypertension: Secondary | ICD-10-CM

## 2020-12-17 DIAGNOSIS — N529 Male erectile dysfunction, unspecified: Secondary | ICD-10-CM

## 2020-12-17 MED ORDER — SILDENAFIL CITRATE 100 MG PO TABS: 50.0000 mg | ORAL_TABLET | Freq: Every day | ORAL | 5 refills | Status: AC | PRN

## 2020-12-17 NOTE — Patient Instructions (Addendum)
You can install the GoodRx app on your smart phone to find the lowest prices for generic medications.   . You can finish your current bottle of valsartan-hctz. I'm going to send in a prescription for a slightly higher dose before your next refill is due in February

## 2020-12-17 NOTE — Progress Notes (Signed)
Established Patient Office Visit  Subjective:  Patient ID: Arthur Bradley, male    DOB: 08-12-1976  Age: 45 y.o. MRN: [id number]  CC: No chief complaint on file.   HPI Arthur Bradley presents for  Hypertension, follow-up  BP Readings from Last 3 Encounters:  10/06/20 (!) 147/101  08/08/20 (!) 166/122  10/12/19 (!) 146/97   Wt Readings from Last 3 Encounters:  10/06/20 223 lb (101.2 kg)  08/08/20 224 lb (101.6 kg)  10/12/19 222 lb 9.6 oz (101 kg)     He was last seen for hypertension 2 months ago.  BP at that visit was 147/101. Management since that visit includes changing triamterene/hctz to valsartan/hctz 160/12.5. he had follow up renal panel on 10/27/2020    He reports fair compliance with treatment. He forgets to take medication occasionally due to busy work schedule, but has not missed any for several days.  He is not having side effects.  He is following a unhealthy diet. He is exercising some. He does smoke.  Use of agents associated with hypertension: none.   Outside blood pressures are 140s/100. Symptoms: No chest pain No chest pressure  No palpitations No syncope  No dyspnea No orthopnea  No paroxysmal nocturnal dyspnea No lower extremity edema   Pertinent labs: Lab Results  Component Value Date   CHOL 247 (H) 10/06/2020   HDL 47 10/06/2020   LDLCALC 172 (H) 10/06/2020   TRIG 155 (H) 10/06/2020   CHOLHDL 5.3 (H) 10/06/2020   Lab Results  Component Value Date   NA 138 10/27/2020   K 4.2 10/27/2020   CREATININE 1.67 (H) 10/27/2020   GFRNONAA 49 (L) 10/27/2020   GFRAA 57 (L) 10/27/2020   GLUCOSE 90 10/27/2020     The 10-year ASCVD risk score Denman George DC Jr., et al., 2013) is: 15.1%   ---------------------------------------------------------------------------------------------------    Outpatient Medications Prior to Visit  Medication Sig Dispense Refill  . amLODipine (NORVASC) 10 MG tablet TAKE 1 TABLET BY MOUTH EVERY DAY 90 tablet 0  .  valsartan-hydrochlorothiazide (DIOVAN-HCT) 160-12.5 MG tablet TAKE 1 TABLET BY MOUTH EVERY DAY 90 tablet 0   No facility-administered medications prior to visit.    No Known Allergies  ROS    Objective:     Today's Vitals   12/17/20 0821  BP: (!) 156/102  Pulse: 78  Temp: 98.6 F (37 C)  TempSrc: Oral  SpO2: 100%  Weight: 235 lb 6.4 oz (106.8 kg)  Height: 5\' 8"  (1.727 m)   Body mass index is 35.79 kg/m.   General appearance: Obese male, cooperative and in no acute distress Head: Normocephalic, without obvious abnormality, atraumatic Respiratory: Respirations even and unlabored, normal respiratory rate Extremities: All extremities are intact.  Skin: Skin color, texture, turgor normal. No rashes seen  Psych: Appropriate mood and affect. Neurologic: Mental status: Alert, oriented to person, place, and time, thought content appropriate.    Assessment & Plan:   1. Essential hypertension Improved with change from triamterene/hctz to valsartan/hctz 160/12.5, but not to goal. Encouraged reducing sodium in diet. He just had prescription refilled for 30 day supply. Will contact pharmacy in February to change next refill to 160/25 and follow up in 3-4 months.   2. Erectile dysfunction, unspecified erectile dysfunction type He was previously prescribed sildenafil which he did well with and requests a refill today.   - sildenafil (VIAGRA) 100 MG tablet; Take 0.5-1 tablets (50-100 mg total) by mouth daily as needed for erectile dysfunction.  Dispense: 10  tablet; Refill: 5   Future Appointments  Date Time Provider Department Center  03/23/2021  8:20 AM Malva Limes, MD [facility]

## 2021-01-07 ENCOUNTER — Other Ambulatory Visit: Payer: Self-pay | Admitting: Family Medicine

## 2021-01-07 DIAGNOSIS — I10 Essential (primary) hypertension: Secondary | ICD-10-CM

## 2021-01-08 ENCOUNTER — Other Ambulatory Visit: Payer: Self-pay | Admitting: Family Medicine

## 2021-01-08 DIAGNOSIS — I10 Essential (primary) hypertension: Secondary | ICD-10-CM

## 2021-01-08 MED ORDER — VALSARTAN-HYDROCHLOROTHIAZIDE 160-12.5 MG PO TABS
1.0000 | ORAL_TABLET | Freq: Every day | ORAL | 3 refills | Status: DC
Start: 2021-01-08 — End: 2021-03-23

## 2021-02-18 ENCOUNTER — Other Ambulatory Visit: Payer: Self-pay | Admitting: Family Medicine

## 2021-02-18 DIAGNOSIS — I10 Essential (primary) hypertension: Secondary | ICD-10-CM

## 2021-03-23 ENCOUNTER — Ambulatory Visit: Payer: No Typology Code available for payment source | Admitting: Family Medicine

## 2021-03-23 ENCOUNTER — Encounter: Payer: Self-pay | Admitting: Family Medicine

## 2021-03-23 ENCOUNTER — Other Ambulatory Visit: Payer: Self-pay

## 2021-03-23 VITALS — BP 154/107 | HR 88 | Temp 98.6°F | Wt 241.6 lb

## 2021-03-23 DIAGNOSIS — L918 Other hypertrophic disorders of the skin: Secondary | ICD-10-CM | POA: Diagnosis not present

## 2021-03-23 DIAGNOSIS — I10 Essential (primary) hypertension: Secondary | ICD-10-CM

## 2021-03-23 MED ORDER — FLUTICASONE PROPIONATE 50 MCG/ACT NA SUSP: 2.0000 | Freq: Every day | NASAL | 1 refills | Status: DC

## 2021-03-23 MED ORDER — VALSARTAN-HYDROCHLOROTHIAZIDE 320-25 MG PO TABS: 1.0000 | ORAL_TABLET | Freq: Every day | ORAL | 0 refills | Status: DC

## 2021-03-23 NOTE — Progress Notes (Signed)
Established patient visit   Patient: Arthur Bradley   DOB: January 27, 1976   45 y.o. Male  MRN: [id number] Visit Date: 03/23/2021  Today's healthcare provider: Mila Merry, MD   Chief Complaint  Patient presents with  . Hypertension   I,Porsha C McClurkin,acting as a scribe for Mila Merry, MD.,have documented all relevant documentation on the behalf of Mila Merry, MD,as directed by  Mila Merry, MD while in the presence of Mila Merry, MD.  Subjective    HPI  Hypertension, follow-up  BP Readings from Last 3 Encounters:  03/23/21 (!) 154/107  12/17/20 (!) 156/102  10/06/20 (!) 147/101   Wt Readings from Last 3 Encounters:  03/23/21 241 lb 9.6 oz (109.6 kg)  12/17/20 235 lb 6.4 oz (106.8 kg)  10/06/20 223 lb (101.2 kg)     He was last seen for hypertension on 12/17/2020.  BP at that visit was 156/102. Management since that visit includes encouraging patient to reduce sodium in diet. He just had prescription refilled for 30 day supply. Will contact pharmacy in February to change next refill to 160/25 and follow up in 3-4 months.  He reports good compliance with treatment. He is not having side effects.  He is following a Regular diet. He is not exercising. He does smoke.  Use of agents associated with hypertension: none.   Outside blood pressures are not being checked. Symptoms: No chest pain No chest pressure  No palpitations No syncope  No dyspnea No orthopnea  No paroxysmal nocturnal dyspnea No lower extremity edema   Pertinent labs: Lab Results  Component Value Date   CHOL 247 (H) 10/06/2020   HDL 47 10/06/2020   LDLCALC 172 (H) 10/06/2020   TRIG 155 (H) 10/06/2020   CHOLHDL 5.3 (H) 10/06/2020   Lab Results  Component Value Date   NA 138 10/27/2020   K 4.2 10/27/2020   CREATININE 1.67 (H) 10/27/2020   GFRNONAA 49 (L) 10/27/2020   GFRAA 57 (L) 10/27/2020   GLUCOSE 90 10/27/2020     The 10-year ASCVD risk score Denman George DC Jr., et al., 2013)  is: 16.4%   ---------------------------------------------------------------------------------------------------     Medications: Outpatient Medications Prior to Visit  Medication Sig  . amLODipine (NORVASC) 10 MG tablet TAKE 1 TABLET BY MOUTH EVERY DAY  . sildenafil (VIAGRA) 100 MG tablet Take 0.5-1 tablets (50-100 mg total) by mouth daily as needed for erectile dysfunction.  . valsartan-hydrochlorothiazide (DIOVAN-HCT) 160-12.5 MG tablet Take 1 tablet by mouth daily.   No facility-administered medications prior to visit.    Review of Systems     Objective    BP (!) 154/107 (BP Location: Left Arm, Patient Position: Sitting, Cuff Size: Large)   Pulse 88   Temp 98.6 F (37 C) (Oral)   Wt 241 lb 9.6 oz (109.6 kg)   SpO2 99%   BMI 36.74 kg/m     Physical Exam  Medium sized skin tag left side of neck.     Assessment & Plan     1. Essential hypertension Uncontrolled, double dose of  valsartan-hydrochlorothiazide (DIOVAN-HCT) to 320-25 MG tablet; Take 1 tablet by mouth daily.  Dispense: 30 tablet; Refill: 0   Will contact patient in 2 weeks to check renal functions.   2. Skin tag Schedule skin tag excision in June, will recheck BP at that time.   3. Allergic rhinitis.  Not controlled with OTC Zyrtec or Claritin. try fluticasone (FLONASE) 50 MCG/ACT nasal spray; Place 2 sprays into  both nostrils daily.  Dispense: 16 g; Refill: 1        The entirety of the information documented in the History of Present Illness, Review of Systems and Physical Exam were personally obtained by me. Portions of this information were initially documented by the CMA and reviewed by me for thoroughness and accuracy.      Mila Merry, MD  [facility] [phone] (phone) [phone] (fax)  [facility]

## 2021-03-23 NOTE — Patient Instructions (Signed)
.   We'll need to check your kidney functions and electrolytes the third week of May to make sure the BP medication is working well with your kidney. We'll give you a reminder call when it's time

## 2021-04-07 ENCOUNTER — Telehealth: Payer: Self-pay | Admitting: Family Medicine

## 2021-04-07 DIAGNOSIS — E7800 Pure hypercholesterolemia, unspecified: Secondary | ICD-10-CM

## 2021-04-07 DIAGNOSIS — I10 Essential (primary) hypertension: Secondary | ICD-10-CM

## 2021-04-07 NOTE — Telephone Encounter (Signed)
I called patient and patient verbalized understanding of information below.   

## 2021-04-07 NOTE — Telephone Encounter (Signed)
Please advise patient it is time to check labs since increasing dose of his BP pill last month. Have entered order for renal panel and lipids. He needs to be fasting.

## 2021-04-14 ENCOUNTER — Other Ambulatory Visit: Payer: Self-pay | Admitting: Family Medicine

## 2021-04-14 NOTE — Telephone Encounter (Signed)
Requested Prescriptions  Pending Prescriptions Disp Refills  . fluticasone (FLONASE) 50 MCG/ACT nasal spray [Pharmacy Med Name: FLUTICASONE PROP 50 MCG SPRAY] 16 mL 1    Sig: SPRAY 2 SPRAYS INTO EACH NOSTRIL EVERY DAY     Ear, Nose, and Throat: Nasal Preparations - Corticosteroids Passed - 04/14/2021 10:35 AM      Passed - Valid encounter within last 12 months    Recent Outpatient Visits          3 weeks ago Essential hypertension   [facility] Malva Limes, MD   3 months ago Essential hypertension   [facility] Malva Limes, MD   6 months ago Essential hypertension   [facility] Malva Limes, MD   8 months ago Essential hypertension   [facility] Malva Limes, MD   1 year ago Acute pancreatitis, unspecified complication status, unspecified pancreatitis type   [facility] Malva Limes, MD      Future Appointments            In 3 weeks Fisher, Demetrios Isaacs, MD [facility]

## 2021-04-27 ENCOUNTER — Other Ambulatory Visit: Payer: Self-pay | Admitting: Family Medicine

## 2021-04-27 NOTE — Telephone Encounter (Signed)
Requested Prescriptions  Pending Prescriptions Disp Refills  . valsartan-hydrochlorothiazide (DIOVAN-HCT) 320-25 MG tablet [Pharmacy Med Name: VALSARTAN-HCTZ 320-25 MG TAB] 30 tablet 0    Sig: TAKE 1 TABLET BY MOUTH EVERY DAY     Cardiovascular: ARB + Diuretic Combos Failed - 04/27/2021 11:33 AM      Failed - K in normal range and within 180 days    Potassium  Date Value Ref Range Status  10/27/2020 4.2 3.5 - 5.2 mmol/L Final         Failed - Na in normal range and within 180 days    Sodium  Date Value Ref Range Status  10/27/2020 138 134 - 144 mmol/L Final         Failed - Cr in normal range and within 180 days    Creatinine, Ser  Date Value Ref Range Status  10/27/2020 1.67 (H) 0.76 - 1.27 mg/dL Final         Failed - Ca in normal range and within 180 days    Calcium  Date Value Ref Range Status  10/27/2020 9.7 8.7 - 10.2 mg/dL Final         Failed - Last BP in normal range    BP Readings from Last 1 Encounters:  03/23/21 (!) 154/107         Passed - Patient is not pregnant      Passed - Valid encounter within last 6 months    Recent Outpatient Visits          1 month ago Essential hypertension   [facility] Malva Limes, MD   4 months ago Essential hypertension   [facility] Malva Limes, MD   6 months ago Essential hypertension   [facility] Malva Limes, MD   8 months ago Essential hypertension   [facility] Malva Limes, MD   1 year ago Acute pancreatitis, unspecified complication status, unspecified pancreatitis type   [facility] Malva Limes, MD      Future Appointments            In 2 weeks Fisher, Demetrios Isaacs, MD [facility], [facility]

## 2021-04-28 ENCOUNTER — Other Ambulatory Visit: Payer: Self-pay | Admitting: Family Medicine

## 2021-04-28 NOTE — Telephone Encounter (Signed)
  Notes to clinic: Patient requesting a 90 day Review for change    Requested Prescriptions  Pending Prescriptions Disp Refills   fluticasone (FLONASE) 50 MCG/ACT nasal spray [Pharmacy Med Name: FLUTICASONE PROP 50 MCG SPRAY] 48 mL 1    Sig: SPRAY 2 SPRAYS INTO EACH NOSTRIL EVERY DAY      Ear, Nose, and Throat: Nasal Preparations - Corticosteroids Passed - 04/28/2021 10:15 AM      Passed - Valid encounter within last 12 months    Recent Outpatient Visits           1 month ago Essential hypertension   [facility] Malva Limes, MD   4 months ago Essential hypertension   [facility] Malva Limes, MD   6 months ago Essential hypertension   [facility] Malva Limes, MD   8 months ago Essential hypertension   [facility] Malva Limes, MD   1 year ago Acute pancreatitis, unspecified complication status, unspecified pancreatitis type   [facility] Malva Limes, MD       Future Appointments             In 1 week Fisher, Demetrios Isaacs, MD [facility], [facility]

## 2021-05-11 ENCOUNTER — Ambulatory Visit: Payer: Self-pay | Admitting: Family Medicine

## 2021-06-01 ENCOUNTER — Other Ambulatory Visit: Payer: Self-pay | Admitting: Family Medicine

## 2021-06-01 DIAGNOSIS — I10 Essential (primary) hypertension: Secondary | ICD-10-CM

## 2021-06-02 NOTE — Telephone Encounter (Signed)
   Notes to clinic awaiting pt to have labs drawn per Dr. Sherrie Mustache.

## 2021-07-09 ENCOUNTER — Other Ambulatory Visit: Payer: Self-pay | Admitting: Family Medicine

## 2021-07-09 DIAGNOSIS — I10 Essential (primary) hypertension: Secondary | ICD-10-CM

## 2021-07-09 NOTE — Telephone Encounter (Signed)
Called patient to review need for labs to be drawn prior to refills. No answer, left voicemail to call back regarding needed lab work prior to further refills.

## 2021-09-21 DIAGNOSIS — E7800 Pure hypercholesterolemia, unspecified: Secondary | ICD-10-CM | POA: Diagnosis not present

## 2021-09-21 DIAGNOSIS — I10 Essential (primary) hypertension: Secondary | ICD-10-CM | POA: Diagnosis not present

## 2021-09-22 ENCOUNTER — Other Ambulatory Visit: Payer: Self-pay | Admitting: Family Medicine

## 2021-09-22 DIAGNOSIS — I10 Essential (primary) hypertension: Secondary | ICD-10-CM

## 2021-09-22 LAB — RENAL FUNCTION PANEL
Albumin: 5.1 g/dL — ABNORMAL HIGH (ref 4.0–5.0)
BUN/Creatinine Ratio: 12 (ref 9–20)
BUN: 18 mg/dL (ref 6–24)
CO2: 26 mmol/L (ref 20–29)
Calcium: 9.9 mg/dL (ref 8.7–10.2)
Chloride: 101 mmol/L (ref 96–106)
Creatinine, Ser: 1.47 mg/dL — ABNORMAL HIGH (ref 0.76–1.27)
Glucose: 100 mg/dL — ABNORMAL HIGH (ref 70–99)
Phosphorus: 3.4 mg/dL (ref 2.8–4.1)
Potassium: 3.9 mmol/L (ref 3.5–5.2)
Sodium: 144 mmol/L (ref 134–144)
eGFR: 60 mL/min/{1.73_m2} (ref >59)

## 2021-09-22 LAB — LIPID PANEL
Chol/HDL Ratio: 5.6 ratio — ABNORMAL HIGH (ref 0.0–5.0)
Cholesterol, Total: 236 mg/dL — ABNORMAL HIGH (ref 100–199)
HDL: 42 mg/dL (ref >39)
LDL Chol Calc (NIH): 171 mg/dL — ABNORMAL HIGH (ref 0–99)
Triglycerides: 125 mg/dL (ref 0–149)
VLDL Cholesterol Cal: 23 mg/dL (ref 5–40)

## 2021-09-22 NOTE — Telephone Encounter (Signed)
Requested medication (s) are due for refill today - yes  Requested medication (s) are on the active medication list -yes  Future visit scheduled -yes  Last refill: 06/02/21  Notes to clinic: call to patient- patient scheduled first available appointment- 2/13. Patient did come for lab work- request RF until appointment     Requested Prescriptions  Pending Prescriptions Disp Refills   valsartan-hydrochlorothiazide (DIOVAN-HCT) 320-25 MG tablet [Pharmacy Med Name: VALSARTAN-HCTZ 320-25 MG TAB] 10 tablet 0    Sig: Take 1 tablet by mouth daily. PATIENT NEEDS TO HAVE PREVIOUSLY ORDERED LABS COMPLETED BEFORE ADDITIONAL REFILLS CAN BE APPROVED. PLEASE CONTACT OFFICE     Cardiovascular: ARB + Diuretic Combos Failed - 09/22/2021  9:15 AM      Failed - Cr in normal range and within 180 days    Creatinine, Ser  Date Value Ref Range Status  09/21/2021 1.47 (H) 0.76 - 1.27 mg/dL Final          Failed - Last BP in normal range    BP Readings from Last 1 Encounters:  03/23/21 (!) 154/107          Failed - Valid encounter within last 6 months    Recent Outpatient Visits           6 months ago Essential hypertension   [facility] Malva Limes, MD   9 months ago Essential hypertension   [facility] Malva Limes, MD   11 months ago Essential hypertension   [facility] Malva Limes, MD   1 year ago Essential hypertension   [facility] Malva Limes, MD   1 year ago Acute pancreatitis, unspecified complication status, unspecified pancreatitis type   [facility] Malva Limes, MD       Future Appointments             In 3 months Fisher, Demetrios Isaacs, MD [facility]            Passed - K in normal range and within 180 days    Potassium  Date Value Ref Range Status  09/21/2021 3.9 3.5 - 5.2 mmol/L Final          Passed - Na in normal range and within 180 days    Sodium   Date Value Ref Range Status  09/21/2021 144 134 - 144 mmol/L Final          Passed - Ca in normal range and within 180 days    Calcium  Date Value Ref Range Status  09/21/2021 9.9 8.7 - 10.2 mg/dL Final          Passed - Patient is not pregnant         Requested Prescriptions  Pending Prescriptions Disp Refills   valsartan-hydrochlorothiazide (DIOVAN-HCT) 320-25 MG tablet [Pharmacy Med Name: VALSARTAN-HCTZ 320-25 MG TAB] 10 tablet 0    Sig: Take 1 tablet by mouth daily. PATIENT NEEDS TO HAVE PREVIOUSLY ORDERED LABS COMPLETED BEFORE ADDITIONAL REFILLS CAN BE APPROVED. PLEASE CONTACT OFFICE     Cardiovascular: ARB + Diuretic Combos Failed - 09/22/2021  9:15 AM      Failed - Cr in normal range and within 180 days    Creatinine, Ser  Date Value Ref Range Status  09/21/2021 1.47 (H) 0.76 - 1.27 mg/dL Final          Failed - Last BP in normal range    BP Readings from Last 1 Encounters:  03/23/21 Marland Kitchen)  154/107          Failed - Valid encounter within last 6 months    Recent Outpatient Visits           6 months ago Essential hypertension   [facility] Malva Limes, MD   9 months ago Essential hypertension   [facility] Malva Limes, MD   11 months ago Essential hypertension   [facility] Malva Limes, MD   1 year ago Essential hypertension   [facility] Malva Limes, MD   1 year ago Acute pancreatitis, unspecified complication status, unspecified pancreatitis type   [facility] Malva Limes, MD       Future Appointments             In 3 months Fisher, Demetrios Isaacs, MD [facility]            Passed - K in normal range and within 180 days    Potassium  Date Value Ref Range Status  09/21/2021 3.9 3.5 - 5.2 mmol/L Final          Passed - Na in normal range and within 180 days    Sodium  Date Value Ref Range Status  09/21/2021 144 134 - 144 mmol/L  Final          Passed - Ca in normal range and within 180 days    Calcium  Date Value Ref Range Status  09/21/2021 9.9 8.7 - 10.2 mg/dL Final          Passed - Patient is not pregnant

## 2021-09-22 NOTE — Telephone Encounter (Signed)
Please review.  It looks like pt had lab work yesterday.   Thanks,   -Vernona Rieger

## 2021-10-14 ENCOUNTER — Other Ambulatory Visit: Payer: Self-pay | Admitting: Family Medicine

## 2021-10-14 DIAGNOSIS — I10 Essential (primary) hypertension: Secondary | ICD-10-CM

## 2021-10-14 NOTE — Telephone Encounter (Signed)
1831- pt called, pt states that he has 6 pills left and needs refill for Dec. Advised will go ahead and send new refill in.   [facility] called and spoke to [place] about the refill(s) Diovan requested. Advised it was sent on 09/22/21 #30/0 refill(s). She states it was received, filled and picked up. Pt called it in requesting for medication so will contact pt to see if he still needing medication.

## 2021-10-26 ENCOUNTER — Ambulatory Visit: Payer: Self-pay

## 2021-10-26 NOTE — Telephone Encounter (Signed)
Pt experiencing chills,chest congestion pain he states its due to mucus in his chest.  Seeking clinical advise and antibiotic. Pt will be taking COVID test this afternoon.   Pt. Started coughing last week. Productive with yellow mucus. Has runny nose as well. Taking Mucinex and Nyquil. Request appointment. COVID 19 test negative today. Appointment made for tomorrow.    Reason for Disposition  [1] Continuous (nonstop) coughing interferes with work or school AND [2] no improvement using cough treatment per Care Advice  Answer Assessment - Initial Assessment Questions 1. ONSET: "When did the cough begin?"      Last week 2. SEVERITY: "How bad is the cough today?"      Severe 3. SPUTUM: "Describe the color of your sputum" (none, dry cough; clear, white, yellow, green)     Yellow 4. HEMOPTYSIS: "Are you coughing up any blood?" If so ask: "How much?" (flecks, streaks, tablespoons, etc.)     No 5. DIFFICULTY BREATHING: "Are you having difficulty breathing?" If Yes, ask: "How bad is it?" (e.g., mild, moderate, severe)    - MILD: No SOB at rest, mild SOB with walking, speaks normally in sentences, can lie down, no retractions, pulse < 100.    - MODERATE: SOB at rest, SOB with minimal exertion and prefers to sit, cannot lie down flat, speaks in phrases, mild retractions, audible wheezing, pulse 100-120.    - SEVERE: Very SOB at rest, speaks in single words, struggling to breathe, sitting hunched forward, retractions, pulse > 120      No 6. FEVER: "Do you have a fever?" If Yes, ask: "What is your temperature, how was it measured, and when did it start?"     No 7. CARDIAC HISTORY: "Do you have any history of heart disease?" (e.g., heart attack, congestive heart failure)      No 8. LUNG HISTORY: "Do you have any history of lung disease?"  (e.g., pulmonary embolus, asthma, emphysema)     No 9. PE RISK FACTORS: "Do you have a history of blood clots?" (or: recent major surgery, recent prolonged travel,  bedridden)     No 10. OTHER SYMPTOMS: "Do you have any other symptoms?" (e.g., runny nose, wheezing, chest pain)       Runny nose 11. PREGNANCY: "Is there any chance you are pregnant?" "When was your last menstrual period?"       N/a 12. TRAVEL: "Have you traveled out of the country in the last month?" (e.g., travel history, exposures)       No  Protocols used: Cough - Acute Productive-A-AH

## 2021-10-27 ENCOUNTER — Encounter: Payer: Self-pay | Admitting: Physician Assistant

## 2021-10-27 ENCOUNTER — Ambulatory Visit: Payer: No Typology Code available for payment source | Admitting: Physician Assistant

## 2021-10-27 ENCOUNTER — Other Ambulatory Visit: Payer: Self-pay

## 2021-10-27 VITALS — BP 182/121 | HR 87 | Temp 98.1°F | Ht 69.0 in | Wt 227.0 lb

## 2021-10-27 DIAGNOSIS — I10 Essential (primary) hypertension: Secondary | ICD-10-CM | POA: Diagnosis not present

## 2021-10-27 DIAGNOSIS — J208 Acute bronchitis due to other specified organisms: Secondary | ICD-10-CM

## 2021-10-27 DIAGNOSIS — Z716 Tobacco abuse counseling: Secondary | ICD-10-CM | POA: Diagnosis not present

## 2021-10-27 MED ORDER — ALBUTEROL SULFATE HFA 108 (90 BASE) MCG/ACT IN AERS: 2.0000 | INHALATION_SPRAY | Freq: Four times a day (QID) | RESPIRATORY_TRACT | 2 refills | Status: DC | PRN

## 2021-10-27 MED ORDER — AMLODIPINE BESYLATE 10 MG PO TABS: 10.0000 mg | ORAL_TABLET | Freq: Every day | ORAL | 1 refills | Status: DC

## 2021-10-27 NOTE — Progress Notes (Signed)
Date:  10/27/2021   Name:  Arthur Bradley   DOB:  1976/07/27   MRN:  [id number]   Chief Complaint: cc. Cough x 1 week  Arthur Bradley is a 45 y/o male who presents today with concerns of productive cough, chest tightness, chills, headaches, nasal congestion, sore throat, postnasal drip, some night sweats, and some wheezing for the last week.  He has tried NyQuil which does help him slightly with his symptoms.  Did not go to work yesterday and is considering not going today as he really does not feel well.  Denies any bloody sputum, denies any measured fevers at home.  States blood pressures been running high but that he is out of his amlodipine. Denies any sick contacts, but did see his family last week and has a young granddaughter.   Lab Results  Component Value Date   NA 144 09/21/2021   K 3.9 09/21/2021   CO2 26 09/21/2021   GLUCOSE 100 (H) 09/21/2021   BUN 18 09/21/2021   CREATININE 1.47 (H) 09/21/2021   CALCIUM 9.9 09/21/2021   EGFR 60 09/21/2021   GFRNONAA 49 (L) 10/27/2020   Lab Results  Component Value Date   CHOL 236 (H) 09/21/2021   HDL 42 09/21/2021   LDLCALC 171 (H) 09/21/2021   TRIG 125 09/21/2021   CHOLHDL 5.6 (H) 09/21/2021   Lab Results  Component Value Date   TSH 3.140 01/27/2016   No results found for: HGBA1C Lab Results  Component Value Date   WBC 5.0 10/06/2020   HGB 15.9 10/06/2020   HCT 46.8 10/06/2020   MCV 89 10/06/2020   PLT 226 10/06/2020   Lab Results  Component Value Date   ALT 37 10/06/2020   AST 31 10/06/2020   ALKPHOS 72 10/06/2020   BILITOT 0.4 10/06/2020   No results found for: 25OHVITD2, 25OHVITD3, VD25OH   Review of Systems  Constitutional:  Positive for chills.  HENT:  Positive for postnasal drip and sore throat.   Respiratory:  Positive for cough, shortness of breath and wheezing.   Cardiovascular:  Positive for chest pain.  Neurological:  Positive for headaches.   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  Current Meds  Medication Sig   fluticasone (FLONASE) 50 MCG/ACT nasal spray Place 2 sprays into both nostrils daily.   guaiFENesin (MUCINEX PO) Take by mouth as needed.   Pseudoeph-Doxylamine-DM-APAP (NYQUIL PO) Take by mouth as needed.   sildenafil (VIAGRA) 100 MG tablet Take 0.5-1 tablets (50-100 mg total) by mouth daily as needed for erectile dysfunction.   valsartan-hydrochlorothiazide (DIOVAN-HCT) 320-25 MG tablet TAKE 1 TABLET BY MOUTH EVERY DAY    PHQ  2/9 Scores 10/27/2021 08/08/2020 09/05/2019 06/12/2018  PHQ - 2 Score $Remov'3 1 2 'fLWxtW$ 0  PHQ- 9 Score $Remov'14 11 7 5    'FlDUPF$ GAD 7 : Generalized Anxiety Score 10/27/2021  Nervous, Anxious, on Edge 0  Control/stop worrying 0  Worry too much - different things 0  Trouble relaxing 0  Restless 0  Easily annoyed or irritable 0  Afraid - awful might happen 0  Total GAD 7 Score 0  Anxiety Difficulty Not difficult at all    BP Readings from Last 3 Encounters:  10/27/21 (!) 182/121  03/23/21 (!) 154/107  12/17/20 (!) 156/102    Physical Exam Constitutional:      General: He is awake.     Appearance: He is well-developed.  HENT:     Head: Normocephalic.  Eyes:     Conjunctiva/sclera: Conjunctivae  normal.  Cardiovascular:     Rate and Rhythm: Normal rate and regular rhythm.     Heart sounds: Normal heart sounds.  Pulmonary:     Effort: Pulmonary effort is normal.     Breath sounds: Normal breath sounds.  Skin:    General: Skin is warm.  Neurological:     Mental Status: He is alert and oriented to person, place, and time.  Psychiatric:        Attention and Perception: Attention normal.        Mood and Affect: Mood normal.        Speech: Speech normal.        Behavior: Behavior is cooperative.    Wt Readings from Last 3 Encounters:  10/27/21 227 lb (103 kg)  03/23/21 241 lb 9.6 oz (109.6 kg)  12/17/20 235 lb 6.4 oz (106.8 kg)    BP (!) 182/121   Pulse 87   Temp 98.1 F (36.7 C) (Oral)   Ht $R'5\' 9"'Xl$  (1.753 m)   Wt 227 lb (103 kg)   SpO2 95%   BMI 33.52 kg/m   Assessment and Plan:  Viral illness, high suspicion for influenza Discussed testing, but out of window for antiviral. Discussed saline spray, using his flonase twice daily, resting, steam showers.  Advised corcidin vs dayquil/nyquil d/t bp elevation Rx albuterol inhaler, advised using for wheezing, prn q 6 hr Advised if fevers return, no improvement in 5 days, to return to office/call and we can complete a chest xray. Currently lungs clear.  2. HTN Pt was out of both meds, recently refilled the valsartan/hctz but was out of the Amlodipine. Refilled today. Encouraged him to track his BP at home.   I, Mikey Kirschner, PA-C have reviewed all documentation for this visit. The documentation on  10/27/2021 for the exam, diagnosis, procedures, and orders are all accurate and complete.   Mikey Kirschner, PA-C [facility]

## 2021-10-28 ENCOUNTER — Ambulatory Visit: Payer: Self-pay | Admitting: *Deleted

## 2021-10-28 NOTE — Telephone Encounter (Signed)
Reason for Disposition  [1] Caller requesting NON-URGENT health information AND [2] PCP's office is the best resource    Seen yesterday by Alfredia Ferguson and needs a note for work.  Answer Assessment - Initial Assessment Questions 1. ONSET: "When did the cough begin?"      Pt calling in c/o chest tightness.   Seen yesterday by Alfredia Ferguson.   She said she thought I had the flu.    She said I needed to rest.   If I needed a note for work to call back.    She prescribed an inhaler yesterday.   I'm taking Mucinex DM.   I'm feeling the same.  I mainly need a note for work.   2. SEVERITY: "How bad is the cough today?"      Same.   I need an antibiotic I think but I don't know.     Nyquil helping.   Coriciden I'm taking it.     Triage not done since seen yesterday and having same symptoms today. 3. SPUTUM: "Describe the color of your sputum" (none, dry cough; clear, white, yellow, green)     *No Answer* 4. HEMOPTYSIS: "Are you coughing up any blood?" If so ask: "How much?" (flecks, streaks, tablespoons, etc.)     *No Answer* 5. DIFFICULTY BREATHING: "Are you having difficulty breathing?" If Yes, ask: "How bad is it?" (e.g., mild, moderate, severe)    - MILD: No SOB at rest, mild SOB with walking, speaks normally in sentences, can lie down, no retractions, pulse < 100.    - MODERATE: SOB at rest, SOB with minimal exertion and prefers to sit, cannot lie down flat, speaks in phrases, mild retractions, audible wheezing, pulse 100-120.    - SEVERE: Very SOB at rest, speaks in single words, struggling to breathe, sitting hunched forward, retractions, pulse > 120      *No Answer* 6. FEVER: "Do you have a fever?" If Yes, ask: "What is your temperature, how was it measured, and when did it start?"     *No Answer* 7. CARDIAC HISTORY: "Do you have any history of heart disease?" (e.g., heart attack, congestive heart failure)      *No Answer* 8. LUNG HISTORY: "Do you have any history of lung disease?"   (e.g., pulmonary embolus, asthma, emphysema)     *No Answer* 9. PE RISK FACTORS: "Do you have a history of blood clots?" (or: recent major surgery, recent prolonged travel, bedridden)     *No Answer* 10. OTHER SYMPTOMS: "Do you have any other symptoms?" (e.g., runny nose, wheezing, chest pain)       *No Answer* 11. PREGNANCY: "Is there any chance you are pregnant?" "When was your last menstrual period?"       *No Answer* 12. TRAVEL: "Have you traveled out of the country in the last month?" (e.g., travel history, exposures)       *No Answer*  Answer Assessment - Initial Assessment Questions 1. REASON FOR CALL or QUESTION: "What is your reason for calling today?" or "How can I best help you?" or "What question do you have that I can help answer?"     Pt was seen yesterday and is needing a note for work.   He is not on MyChart so he was given the MyChart Help Desk number because he is having trouble getting on.  Protocols used: Cough - Acute Non-Productive-A-AH, Information Only Call - No Triage-A-AH

## 2021-10-28 NOTE — Telephone Encounter (Signed)
Pt called in.   He was seen yesterday by Alfredia Ferguson for chest tightness and URI symptoms.   He is having the same symptoms today.    He said she told him to call back if he needed a note for work.   He does need a note for work but he is not on MyChart and is having difficulty getting on.   I gave him the MyChart Help Desk number so he is going to call them and get set up so he can get his work note from [employer] via his MyChart.  I sent his request for a work note to [facility] for [employer].

## 2021-10-28 NOTE — Telephone Encounter (Signed)
Patient advised letter is done and ready for him to print.

## 2021-11-07 ENCOUNTER — Other Ambulatory Visit: Payer: Self-pay | Admitting: Family Medicine

## 2021-11-07 DIAGNOSIS — I10 Essential (primary) hypertension: Secondary | ICD-10-CM

## 2021-11-09 ENCOUNTER — Ambulatory Visit: Payer: No Typology Code available for payment source | Admitting: Family Medicine

## 2021-11-21 ENCOUNTER — Other Ambulatory Visit: Payer: Self-pay | Admitting: Family Medicine

## 2021-11-21 DIAGNOSIS — I10 Essential (primary) hypertension: Secondary | ICD-10-CM

## 2021-11-21 NOTE — Telephone Encounter (Signed)
last RF 10/27/21 #90 1 RF Requesting too soon  Requested Prescriptions  Refused Prescriptions Disp Refills   amLODipine (NORVASC) 10 MG tablet [Pharmacy Med Name: AMLODIPINE BESYLATE 10 MG TAB] 90 tablet 1    Sig: TAKE 1 TABLET BY MOUTH EVERY DAY     Cardiovascular:  Calcium Channel Blockers Failed - 11/21/2021  1:05 PM      Failed - Last BP in normal range    BP Readings from Last 1 Encounters:  10/27/21 (!) 182/121         Passed - Valid encounter within last 6 months    Recent Outpatient Visits          3 weeks ago Viral bronchitis   [facility] [place], [place], PA-C   8 months ago Essential hypertension   [facility] Malva Limes, MD   11 months ago Essential hypertension   [facility] Malva Limes, MD   1 year ago Essential hypertension   [facility] Malva Limes, MD   1 year ago Essential hypertension   [facility] Malva Limes, MD      Future Appointments            In 1 month Fisher, Demetrios Isaacs, MD [facility]   In 1 month Fisher, Demetrios Isaacs, MD [facility]

## 2021-12-02 DIAGNOSIS — Z20822 Contact with and (suspected) exposure to covid-19: Secondary | ICD-10-CM | POA: Diagnosis not present

## 2021-12-11 ENCOUNTER — Other Ambulatory Visit: Payer: Self-pay | Admitting: Family Medicine

## 2021-12-11 DIAGNOSIS — I10 Essential (primary) hypertension: Secondary | ICD-10-CM

## 2021-12-21 ENCOUNTER — Ambulatory Visit: Payer: No Typology Code available for payment source | Admitting: Family Medicine

## 2022-01-04 ENCOUNTER — Ambulatory Visit: Payer: No Typology Code available for payment source | Admitting: Family Medicine

## 2022-01-04 NOTE — Progress Notes (Unsigned)
Established patient visit   Patient: Arthur Bradley   DOB: 1976-06-13   46 y.o. Male  MRN: [id number] Visit Date: 01/04/2022  Today's healthcare provider: Lelon Huh, MD   No chief complaint on file.  Subjective    HPI  Hypertension, follow-up  BP Readings from Last 3 Encounters:  10/27/21 (!) 182/121  03/23/21 (!) 154/107  12/17/20 (!) 156/102   Wt Readings from Last 3 Encounters:  10/27/21 227 lb (103 kg)  03/23/21 241 lb 9.6 oz (109.6 kg)  12/17/20 235 lb 6.4 oz (106.8 kg)     He was last seen for hypertension on 10/27/2021. BP at that visit was 182/121. Management since that visit includes restarting medications and monitoring blood pressure at home.  He reports {excellent/good/fair/poor:19665} compliance with treatment. He {is/is not:9024} having side effects. {document side effects if present:1} He is following a {diet:21022986} diet. He {is/is not:9024} exercising. He {does/does not:200015} smoke.  Use of agents associated with hypertension: {bp agents assoc with hypertension:511::"none"}.   Outside blood pressures are {***enter patient reported home BP readings, or 'not being checked':1}. Symptoms: {Yes/No:20286} chest pain {Yes/No:20286} chest pressure  {Yes/No:20286} palpitations {Yes/No:20286} syncope  {Yes/No:20286} dyspnea {Yes/No:20286} orthopnea  {Yes/No:20286} paroxysmal nocturnal dyspnea {Yes/No:20286} lower extremity edema   Pertinent labs: Lab Results  Component Value Date   CHOL 236 (H) 09/21/2021   HDL 42 09/21/2021   LDLCALC 171 (H) 09/21/2021   TRIG 125 09/21/2021   CHOLHDL 5.6 (H) 09/21/2021   Lab Results  Component Value Date   NA 144 09/21/2021   K 3.9 09/21/2021   CREATININE 1.47 (H) 09/21/2021   EGFR 60 09/21/2021   GLUCOSE 100 (H) 09/21/2021   TSH 3.140 01/27/2016     The 10-year ASCVD risk score (Arnett DK, et al., 2019) is: 23.4%    ---------------------------------------------------------------------------------------------------   Medications: Outpatient Medications Prior to Visit  Medication Sig   albuterol (VENTOLIN HFA) 108 (90 Base) MCG/ACT inhaler Inhale 2 puffs into the lungs every 6 (six) hours as needed for wheezing or shortness of breath.   amLODipine (NORVASC) 10 MG tablet Take 1 tablet (10 mg total) by mouth daily.   fluticasone (FLONASE) 50 MCG/ACT nasal spray Place 2 sprays into both nostrils daily.   guaiFENesin (MUCINEX PO) Take by mouth as needed.   Pseudoeph-Doxylamine-DM-APAP (NYQUIL PO) Take by mouth as needed.   sildenafil (VIAGRA) 100 MG tablet Take 0.5-1 tablets (50-100 mg total) by mouth daily as needed for erectile dysfunction.   valsartan-hydrochlorothiazide (DIOVAN-HCT) 320-25 MG tablet TAKE 1 TABLET BY MOUTH EVERY DAY   No facility-administered medications prior to visit.    Review of Systems  {Labs   Heme   Chem   Endocrine   Serology   Results Review (optional):23779}   Objective    There were no vitals taken for this visit. {Show previous vital signs (optional):23777}  Physical Exam  ***  No results found for any visits on 01/04/22.  Assessment & Plan     ***  No follow-ups on file.      {provider attestation***:1}   Lelon Huh, MD  [facility] [phone] (phone) [phone] (fax)  [place]

## 2022-01-10 ENCOUNTER — Other Ambulatory Visit: Payer: Self-pay | Admitting: Family Medicine

## 2022-01-10 DIAGNOSIS — I10 Essential (primary) hypertension: Secondary | ICD-10-CM

## 2022-02-06 ENCOUNTER — Other Ambulatory Visit: Payer: Self-pay | Admitting: Family Medicine

## 2022-02-06 DIAGNOSIS — I10 Essential (primary) hypertension: Secondary | ICD-10-CM

## 2022-02-17 ENCOUNTER — Other Ambulatory Visit: Payer: Self-pay | Admitting: Family Medicine

## 2022-02-17 DIAGNOSIS — I10 Essential (primary) hypertension: Secondary | ICD-10-CM

## 2022-04-30 ENCOUNTER — Other Ambulatory Visit: Payer: Self-pay | Admitting: Family Medicine

## 2022-04-30 DIAGNOSIS — I10 Essential (primary) hypertension: Secondary | ICD-10-CM

## 2022-04-30 MED ORDER — VALSARTAN-HYDROCHLOROTHIAZIDE 320-25 MG PO TABS: 1.0000 | ORAL_TABLET | Freq: Every day | ORAL | 0 refills | Status: DC

## 2022-04-30 NOTE — Telephone Encounter (Signed)
Medication Refill - Medication: valsartan-hydrochlorothiazide (DIOVAN-HCT) 320-25 MG tablet   Pt is out of this med, send short supply please, if possible  Has the patient contacted their pharmacy? yes (Agent: If no, request that the patient contact the pharmacy for the refill. If patient does not wish to contact the pharmacy document the reason why and proceed with request.) (Agent: If yes, when and what did the pharmacy advise?)  Preferred Pharmacy (with phone number or street name):  [id number] [place] [address]  Phone:  [phone] Fax:  [phone] Has the patient been seen for an appointment in the last year OR does the patient have an upcoming appointment? yes  Agent: Please be advised that RX refills may take up to 3 business days. We ask that you follow-up with your pharmacy.

## 2022-04-30 NOTE — Telephone Encounter (Signed)
Appointment scheduled 7/12- #33 courtesy sent to pharmacy  Requested Prescriptions  Pending Prescriptions Disp Refills  . valsartan-hydrochlorothiazide (DIOVAN-HCT) 320-25 MG tablet 33 tablet 0    Sig: Take 1 tablet by mouth daily.     Cardiovascular: ARB + Diuretic Combos Failed - 04/30/2022 12:13 PM      Failed - K in normal range and within 180 days    Potassium  Date Value Ref Range Status  09/21/2021 3.9 3.5 - 5.2 mmol/L Final         Failed - Na in normal range and within 180 days    Sodium  Date Value Ref Range Status  09/21/2021 144 134 - 144 mmol/L Final         Failed - Cr in normal range and within 180 days    Creatinine, Ser  Date Value Ref Range Status  09/21/2021 1.47 (H) 0.76 - 1.27 mg/dL Final         Failed - eGFR is 10 or above and within 180 days    GFR calc Af Amer  Date Value Ref Range Status  10/27/2020 57 (L) >59 mL/min/1.73 Final    Comment:    **In accordance with recommendations from the NKF-ASN Task force,**   [facility] is in the process of updating its eGFR calculation to the   2021 CKD-EPI creatinine equation that estimates kidney function   without a race variable.    GFR calc non Af Amer  Date Value Ref Range Status  10/27/2020 49 (L) >59 mL/min/1.73 Final   eGFR  Date Value Ref Range Status  09/21/2021 60 >59 mL/min/1.73 Final         Failed - Last BP in normal range    BP Readings from Last 1 Encounters:  10/27/21 (!) 182/121         Failed - Valid encounter within last 6 months    Recent Outpatient Visits          6 months ago Viral bronchitis   [facility] Mikey Kirschner, PA-C   1 year ago Essential hypertension   [facility] Birdie Sons, MD   1 year ago Essential hypertension   [facility] Birdie Sons, MD   1 year ago Essential hypertension   [facility] Birdie Sons, MD   1 year ago Essential hypertension   [facility] Birdie Sons, MD      Future Appointments            In 1 month Fisher, Kirstie Peri, MD [facility], [place] - Patient is not pregnant

## 2022-06-01 NOTE — Progress Notes (Signed)
I,Roshena L Chambers,acting as a scribe for Lelon Huh, MD.,have documented all relevant documentation on the behalf of Lelon Huh, MD,as directed by  Lelon Huh, MD while in the presence of Lelon Huh, MD.   Established patient visit   Patient: Arthur Bradley   DOB: [birth year]   46 y.o. Male  MRN: [id number] Visit Date: 06/02/2022  Today's healthcare provider: Lelon Huh, MD   Chief Complaint  Patient presents with   Hypertension   Subjective    HPI  Hypertension, follow-up  BP Readings from Last 3 Encounters:  06/02/22 (!) 148/105  10/27/21 (!) 182/121  03/23/21 (!) 154/107   Wt Readings from Last 3 Encounters:  06/02/22 233 lb (105.7 kg)  10/27/21 227 lb (103 kg)  03/23/21 241 lb 9.6 oz (109.6 kg)     He was last seen for hypertension 7 months ago.  BP at that visit was 182/121. Management since that visit includes continuing same medication. Patient was out of both meds; refills provided. Encouraged him to track his BP at home.  He reports fair compliance with treatment. He is not having side effects.  He is following a Regular diet. He is not exercising. He does smoke.  Use of agents associated with hypertension: none.   Outside blood pressures are not checked. Symptoms: No chest pain No chest pressure  No palpitations No syncope  No dyspnea No orthopnea  No paroxysmal nocturnal dyspnea No lower extremity edema   Pertinent labs Lab Results  Component Value Date   CHOL 236 (H) 09/21/2021   HDL 42 09/21/2021   LDLCALC 171 (H) 09/21/2021   TRIG 125 09/21/2021   CHOLHDL 5.6 (H) 09/21/2021   Lab Results  Component Value Date   NA 144 09/21/2021   K 3.9 09/21/2021   CREATININE 1.47 (H) 09/21/2021   EGFR 60 09/21/2021   GLUCOSE 100 (H) 09/21/2021   TSH 3.140 01/27/2016     The 10-year ASCVD risk score (Arnett DK, et al., 2019) is:  17.3%  ---------------------------------------------------------------------------------------------------   Medications: Outpatient Medications Prior to Visit  Medication Sig   albuterol (VENTOLIN HFA) 108 (90 Base) MCG/ACT inhaler Inhale 2 puffs into the lungs every 6 (six) hours as needed for wheezing or shortness of breath.   amLODipine (NORVASC) 10 MG tablet Take 1 tablet (10 mg total) by mouth daily.   fluticasone (FLONASE) 50 MCG/ACT nasal spray Place 2 sprays into both nostrils daily.   guaiFENesin (MUCINEX PO) Take by mouth as needed.   Pseudoeph-Doxylamine-DM-APAP (NYQUIL PO) Take by mouth as needed.   sildenafil (VIAGRA) 100 MG tablet Take 0.5-1 tablets (50-100 mg total) by mouth daily as needed for erectile dysfunction.   valsartan-hydrochlorothiazide (DIOVAN-HCT) 320-25 MG tablet Take 1 tablet by mouth daily.   No facility-administered medications prior to visit.    Review of Systems  Constitutional:  Positive for fatigue. Negative for appetite change, chills and fever.       Hypersomnia  Respiratory:  Negative for chest tightness, shortness of breath and wheezing.   Cardiovascular:  Negative for chest pain and palpitations.  Gastrointestinal:  Negative for abdominal pain, nausea and vomiting.       Objective    BP (!) 148/105 (BP Location: Left Arm, Patient Position: Sitting, Cuff Size: Large)   Pulse 92   Temp 98.4 F (36.9 C) (Oral)   Resp 16   Wt 233 lb (105.7 kg)   SpO2 99% Comment: room air  BMI 34.41 kg/m    Physical  Exam    General: Appearance:    Obese male in no acute distress  Eyes:    PERRL, conjunctiva/corneas clear, EOM's intact       Lungs:     Clear to auscultation bilaterally, respirations unlabored  Heart:    Normal heart rate. Normal rhythm. No murmurs, rubs, or gallops.    MS:   All extremities are intact.    Neurologic:   Awake, alert, oriented x 3. No apparent focal neurological defect.         Assessment & Plan     1. Current  tobacco use  - buPROPion (WELLBUTRIN SR) 150 MG 12 hr tablet; 1 tablet daily for 3 days, then 1 tablet twice daily. Stop smoking 14 days after starting medication  Dispense: 60 tablet; Refill: 3  2. Essential hypertension Improved, but not at goal since taking diovan-hct and amlodipine consistently.  - valsartan-hydrochlorothiazide (DIOVAN-HCT) 320-25 MG tablet; Take 1 tablet by mouth   Consider adding third medication, however he would like to try medication to lose weight first which he feels would help his blood pressure.   3. Class 1 obesity with serious comorbidity in adult, unspecified BMI, unspecified obesity type  - Semaglutide-Weight Management (WEGOVY) 0.25 MG/0.5ML SOAJ; Inject 0.25 mg into the skin once a week.  Dispense: 2 mL; Refill: 0      The entirety of the information documented in the History of Present Illness, Review of Systems and Physical Exam were personally obtained by me. Portions of this information were initially documented by the CMA and reviewed by me for thoroughness and accuracy.     Lelon Huh, MD  [facility] [phone] (phone) [phone] (fax)  [place]

## 2022-06-02 ENCOUNTER — Encounter: Payer: Self-pay | Admitting: Family Medicine

## 2022-06-02 ENCOUNTER — Ambulatory Visit (INDEPENDENT_AMBULATORY_CARE_PROVIDER_SITE_OTHER): Payer: BC Managed Care – PPO | Admitting: Family Medicine

## 2022-06-02 ENCOUNTER — Other Ambulatory Visit: Payer: Self-pay | Admitting: Family Medicine

## 2022-06-02 VITALS — BP 148/105 | HR 92 | Temp 98.4°F | Resp 16 | Ht 69.0 in | Wt 233.0 lb

## 2022-06-02 DIAGNOSIS — I10 Essential (primary) hypertension: Secondary | ICD-10-CM

## 2022-06-02 DIAGNOSIS — Z720 Tobacco use: Secondary | ICD-10-CM | POA: Diagnosis not present

## 2022-06-02 DIAGNOSIS — E669 Obesity, unspecified: Secondary | ICD-10-CM

## 2022-06-02 MED ORDER — BUPROPION HCL ER (SR) 150 MG PO TB12: ORAL_TABLET | ORAL | 3 refills | Status: DC

## 2022-06-02 MED ORDER — WEGOVY 0.25 MG/0.5ML ~~LOC~~ SOAJ: 0.2500 mg | SUBCUTANEOUS | 0 refills | Status: DC

## 2022-06-02 MED ORDER — VALSARTAN-HYDROCHLOROTHIAZIDE 320-25 MG PO TABS: 1.0000 | ORAL_TABLET | Freq: Every day | ORAL | 1 refills | Status: DC

## 2022-06-02 NOTE — Telephone Encounter (Signed)
Pt had appt today, reordered at appt by Dr. Caryn Section Requested Prescriptions  Pending Prescriptions Disp Refills  . valsartan-hydrochlorothiazide (DIOVAN-HCT) 320-25 MG tablet [Pharmacy Med Name: VALSARTAN-HCTZ 320-25 MG TAB] 33 tablet 0    Sig: TAKE 1 TABLET BY MOUTH EVERY DAY     Cardiovascular: ARB + Diuretic Combos Failed - 06/02/2022  2:49 AM      Failed - K in normal range and within 180 days    Potassium  Date Value Ref Range Status  09/21/2021 3.9 3.5 - 5.2 mmol/L Final         Failed - Na in normal range and within 180 days    Sodium  Date Value Ref Range Status  09/21/2021 144 134 - 144 mmol/L Final         Failed - Cr in normal range and within 180 days    Creatinine, Ser  Date Value Ref Range Status  09/21/2021 1.47 (H) 0.76 - 1.27 mg/dL Final         Failed - eGFR is 10 or above and within 180 days    GFR calc Af Amer  Date Value Ref Range Status  10/27/2020 57 (L) >59 mL/min/1.73 Final    Comment:    **In accordance with recommendations from the NKF-ASN Task force,**   [facility] is in the process of updating its eGFR calculation to the   2021 CKD-EPI creatinine equation that estimates kidney function   without a race variable.    GFR calc non Af Amer  Date Value Ref Range Status  10/27/2020 49 (L) >59 mL/min/1.73 Final   eGFR  Date Value Ref Range Status  09/21/2021 60 >59 mL/min/1.73 Final         Failed - Last BP in normal range    BP Readings from Last 1 Encounters:  06/02/22 (!) 148/105         Failed - Valid encounter within last 6 months    Recent Outpatient Visits          Today Current tobacco use   [place], MD   7 months ago Viral bronchitis   [facility] Mikey Kirschner, PA-C   1 year ago Essential hypertension   [facility] Birdie Sons, MD   1 year ago Essential hypertension   [facility] Birdie Sons, MD   1 year ago Essential hypertension    [facility] Birdie Sons, MD      Future Appointments            In 3 months Fisher, Kirstie Peri, MD [facility], [place] - Patient is not pregnant

## 2022-06-09 ENCOUNTER — Telehealth: Payer: Self-pay | Admitting: Family Medicine

## 2022-06-09 NOTE — Telephone Encounter (Signed)
Pt stated he was advised by the pharmacy that PA is needed from PCP for medication Semaglutide-Weight Management (WEGOVY) 0.25 MG/0.5ML SOAJ.  Pt mentioned he was also advised they don't have medication in stock at this time.  Please advise.

## 2022-06-10 NOTE — Telephone Encounter (Signed)
PA in progress Key: [id number] waiting for response

## 2022-06-11 NOTE — Telephone Encounter (Signed)
Request denied and pt informed.

## 2022-07-03 ENCOUNTER — Other Ambulatory Visit: Payer: Self-pay | Admitting: Family Medicine

## 2022-07-03 DIAGNOSIS — Z720 Tobacco use: Secondary | ICD-10-CM

## 2022-09-05 ENCOUNTER — Other Ambulatory Visit: Payer: Self-pay | Admitting: Physician Assistant

## 2022-09-05 DIAGNOSIS — I10 Essential (primary) hypertension: Secondary | ICD-10-CM

## 2022-09-06 ENCOUNTER — Ambulatory Visit (INDEPENDENT_AMBULATORY_CARE_PROVIDER_SITE_OTHER): Payer: BC Managed Care – PPO | Admitting: Family Medicine

## 2022-09-06 VITALS — BP 135/92 | HR 94 | Temp 98.3°F | Wt 230.0 lb

## 2022-09-06 DIAGNOSIS — J208 Acute bronchitis due to other specified organisms: Secondary | ICD-10-CM

## 2022-09-06 DIAGNOSIS — I10 Essential (primary) hypertension: Secondary | ICD-10-CM

## 2022-09-06 DIAGNOSIS — Z6835 Body mass index (BMI) 35.0-35.9, adult: Secondary | ICD-10-CM

## 2022-09-06 DIAGNOSIS — R059 Cough, unspecified: Secondary | ICD-10-CM

## 2022-09-06 MED ORDER — ALBUTEROL SULFATE HFA 108 (90 BASE) MCG/ACT IN AERS: 2.0000 | INHALATION_SPRAY | Freq: Four times a day (QID) | RESPIRATORY_TRACT | 2 refills | Status: AC | PRN

## 2022-09-06 MED ORDER — VALSARTAN-HYDROCHLOROTHIAZIDE 320-25 MG PO TABS: 1.0000 | ORAL_TABLET | Freq: Every day | ORAL | 2 refills | Status: DC

## 2022-09-06 MED ORDER — AMLODIPINE BESYLATE 10 MG PO TABS: 10.0000 mg | ORAL_TABLET | Freq: Every day | ORAL | 1 refills | Status: DC

## 2022-09-06 NOTE — Progress Notes (Signed)
Established patient visit   Patient: Arthur Bradley   DOB: 11/10/1976   46 y.o. Male  MRN: [id number] Visit Date: 09/06/2022  Today's healthcare provider: Lelon Huh, MD    Subjective    HPI  Hypertension, follow-up  BP Readings from Last 3 Encounters:  09/06/22 (!) 135/92  06/02/22 (!) 148/105  10/27/21 (!) 182/121   Wt Readings from Last 3 Encounters:  09/06/22 230 lb (104.3 kg)  06/02/22 233 lb (105.7 kg)  10/27/21 227 lb (103 kg)     He was last seen for hypertension 3 months ago.  BP at that visit was 148/105. Management since that visit includes working on weight loss and prescribed semaglutide.   He reports fair compliance with treatment.Said he forgets to take it about twice per week. He is not having side effects.  He is following a Regular diet. He is not exercising. He does smoke. Smokes about 1/2 pack per day.  Use of agents associated with hypertension: none.   Outside blood pressures are not being checked regularly.  States he did check it this morning and it was 150/98. Symptoms: No chest pain No chest pressure  No palpitations No syncope  No dyspnea No orthopnea  No paroxysmal nocturnal dyspnea No lower extremity edema   Pertinent labs Lab Results  Component Value Date   CHOL 236 (H) 09/21/2021   HDL 42 09/21/2021   LDLCALC 171 (H) 09/21/2021   TRIG 125 09/21/2021   CHOLHDL 5.6 (H) 09/21/2021   Lab Results  Component Value Date   NA 144 09/21/2021   K 3.9 09/21/2021   CREATININE 1.47 (H) 09/21/2021   EGFR 60 09/21/2021   GLUCOSE 100 (H) 09/21/2021   TSH 3.140 01/27/2016     The 10-year ASCVD risk score (Arnett DK, et al., 2019) is: 14.7%  ---------------------------------------------------------------------------------------------------  Obesity: He was previously prescribed Wegovy, however his insurance denied coverage, requiring 6 months on a formal weight management program first. He would like to proceed with referral to  weight management program.    Medications: Outpatient Medications Prior to Visit  Medication Sig   albuterol (VENTOLIN HFA) 108 (90 Base) MCG/ACT inhaler Inhale 2 puffs into the lungs every 6 (six) hours as needed for wheezing or shortness of breath.   amLODipine (NORVASC) 10 MG tablet Take 1 tablet (10 mg total) by mouth daily.   buPROPion (WELLBUTRIN SR) 150 MG 12 hr tablet Take 1 tablet (150 mg total) by mouth daily.   fluticasone (FLONASE) 50 MCG/ACT nasal spray Place 2 sprays into both nostrils daily.   guaiFENesin (MUCINEX PO) Take by mouth as needed.   Pseudoeph-Doxylamine-DM-APAP (NYQUIL PO) Take by mouth as needed.   sildenafil (VIAGRA) 100 MG tablet Take 0.5-1 tablets (50-100 mg total) by mouth daily as needed for erectile dysfunction.   valsartan-hydrochlorothiazide (DIOVAN-HCT) 320-25 MG tablet Take 1 tablet by mouth daily.   Semaglutide-Weight Management (WEGOVY) 0.25 MG/0.5ML SOAJ Inject 0.25 mg into the skin once a week. (Patient not taking: Reported on 09/06/2022)   No facility-administered medications prior to visit.         Objective    BP (!) 135/92 (BP Location: Right Arm, Patient Position: Sitting, Cuff Size: Large)   Pulse 94   Temp 98.3 F (36.8 C) (Oral)   Wt 230 lb (104.3 kg)   SpO2 97%   BMI 33.97 kg/m    Vitals:   09/06/22 0905 09/06/22 0909  BP: (!) 138/101 (!) 135/92  Pulse: 94  Temp: 98.3 F (36.8 C)   TempSrc: Oral   SpO2: 97%   Weight: 230 lb (104.3 kg)     Physical Exam    General: Appearance:    Mildly obese male in no acute distress  Eyes:    PERRL, conjunctiva/corneas clear, EOM's intact       Lungs:     Clear to auscultation bilaterally, respirations unlabored  Heart:    Normal heart rate. Normal rhythm. No murmurs, rubs, or gallops.    MS:   All extremities are intact.    Neurologic:   Awake, alert, oriented x 3. No apparent focal neurological defect.         Assessment & Plan     1. Class 2 severe obesity with serious  comorbidity and body mass index (BMI) of 35.0 to 35.9 in adult, unspecified obesity type [facility]) Wegovy denied, requires 6 months formal weight program .  - Amb Ref to Medical Weight Management  2. Essential hypertension Is improving, but not at goal. He is going to work on continue weight loss.   Continue and refill amLODipine (NORVASC) 10 MG tablet; Take 1 tablet (10 mg total) by mouth daily.  Dispense: 90 tablet; Refill: 1 - valsartan-hydrochlorothiazide (DIOVAN-HCT) 320-25 MG tablet; Take 1 tablet by mouth daily.  Dispense: 90 tablet; Refill: 2  - Amb Ref to Medical Weight Management  Advised will need to add third blood pressure medication if not improved at follow up.   3. Cough, unspecified type He was prescribed  albuterol for bronchitis and reports he occasional has similar cough and request refill for  albuterol (VENTOLIN HFA) 108 (90 Base) MCG/ACT inhaler; Inhale 2 puffs into the lungs every 6 (six) hours as needed for wheezing or shortness of breath.  Dispense: 8 g; Refill: 2   Encouraged smoking cessation. He does have prescription for bupropion which he takes occasionally.      The entirety of the information documented in the History of Present Illness, Review of Systems and Physical Exam were personally obtained by me. Portions of this information were initially documented by the CMA and reviewed by me for thoroughness and accuracy.     Lelon Huh, MD  [facility] [phone] (phone) [phone] (fax)  [place]

## 2022-09-11 ENCOUNTER — Other Ambulatory Visit: Payer: Self-pay | Admitting: Family Medicine

## 2022-09-11 DIAGNOSIS — I10 Essential (primary) hypertension: Secondary | ICD-10-CM

## 2022-10-06 ENCOUNTER — Encounter (INDEPENDENT_AMBULATORY_CARE_PROVIDER_SITE_OTHER): Payer: Self-pay

## 2022-10-21 ENCOUNTER — Encounter: Payer: Self-pay | Admitting: Family Medicine

## 2022-10-21 NOTE — Telephone Encounter (Signed)
Patient is scheduled for appt tomorrow with Dr. Sherrie Mustache.

## 2022-10-22 ENCOUNTER — Ambulatory Visit: Payer: BC Managed Care – PPO | Admitting: Family Medicine

## 2022-10-22 ENCOUNTER — Encounter: Payer: Self-pay | Admitting: Family Medicine

## 2022-10-22 VITALS — BP 131/92 | HR 99 | Temp 98.1°F | Resp 16 | Ht 68.0 in | Wt 230.0 lb

## 2022-10-22 DIAGNOSIS — R052 Subacute cough: Secondary | ICD-10-CM | POA: Diagnosis not present

## 2022-10-22 DIAGNOSIS — J321 Chronic frontal sinusitis: Secondary | ICD-10-CM

## 2022-10-22 MED ORDER — HYDROCODONE BIT-HOMATROP MBR 5-1.5 MG/5ML PO SOLN: 5.0000 mL | Freq: Three times a day (TID) | ORAL | 0 refills | Status: DC | PRN

## 2022-10-22 MED ORDER — AMOXICILLIN-POT CLAVULANATE 875-125 MG PO TABS: 1.0000 | ORAL_TABLET | Freq: Two times a day (BID) | ORAL | 0 refills | Status: AC

## 2022-10-22 NOTE — Progress Notes (Signed)
I,Tiffany J Bragg,acting as a scribe for Mila Merry, MD.,have documented all relevant documentation on the behalf of Mila Merry, MD,as directed by  Mila Merry, MD while in the presence of Mila Merry, MD.   Established patient visit   Patient: Arthur Bradley   DOB: 09/18/1976   46 y.o. Male  MRN: [id number] Visit Date: 10/22/2022  Today's healthcare provider: Mila Merry, MD   Chief Complaint  Patient presents with   Cough    Patient complains of cough, chills, sneezing, sore throat, congestion for over a week that is getting progressively worse.    Subjective    HPI As above, is worse at night, has a hard time catching breath when lying down due to coughing. Having pressure in sinus. No drainage due to congestion.   Medications: Outpatient Medications Prior to Visit  Medication Sig   albuterol (VENTOLIN HFA) 108 (90 Base) MCG/ACT inhaler Inhale 2 puffs into the lungs every 6 (six) hours as needed for wheezing or shortness of breath.   amLODipine (NORVASC) 10 MG tablet Take 1 tablet (10 mg total) by mouth daily.   buPROPion (WELLBUTRIN SR) 150 MG 12 hr tablet Take 1 tablet (150 mg total) by mouth daily.   fluticasone (FLONASE) 50 MCG/ACT nasal spray Place 2 sprays into both nostrils daily.   guaiFENesin (MUCINEX PO) Take by mouth as needed.   Pseudoeph-Doxylamine-DM-APAP (NYQUIL PO) Take by mouth as needed.   Semaglutide-Weight Management (WEGOVY) 0.25 MG/0.5ML SOAJ Inject 0.25 mg into the skin once a week.   sildenafil (VIAGRA) 100 MG tablet Take 0.5-1 tablets (50-100 mg total) by mouth daily as needed for erectile dysfunction.   valsartan-hydrochlorothiazide (DIOVAN-HCT) 320-25 MG tablet Take 1 tablet by mouth daily.   No facility-administered medications prior to visit.    Review of Systems  Constitutional:  Positive for chills. Negative for appetite change and fever.  Respiratory:  Positive for shortness of breath and wheezing. Negative for chest  tightness.   Cardiovascular:  Negative for chest pain and palpitations.  Gastrointestinal:  Negative for abdominal pain, nausea and vomiting.       Objective    BP (!) 131/92 (BP Location: Right Arm, Patient Position: Sitting, Cuff Size: Large)   Pulse 99   Temp 98.1 F (36.7 C) (Oral)   Resp 16   Ht 5\' 8"  (1.727 m)   Wt 230 lb (104.3 kg)   SpO2 98%   BMI 34.97 kg/m    Physical Exam   General Appearance:    Mildly obese male, alert, cooperative, in no acute distress  HENT:   neck without nodes, pharynx erythematous without exudate, frontal sinus tender, and nasal mucosa congested  Eyes:    PERRL, conjunctiva/corneas clear, EOM's intact       Lungs:     Occasional expiratory wheezes, respirations unlabored  Heart:    Normal heart rate. Normal rhythm. No murmurs, rubs, or gallops.    Neurologic:   Awake, alert, oriented x 3. No apparent focal neurological           defect.         Assessment & Plan     1. Subacute cough  - HYDROcodone bit-homatropine (HYCODAN) 5-1.5 MG/5ML syrup; Take 5 mLs by mouth every 8 (eight) hours as needed for cough.  Dispense: 120 mL; Refill: 0  2. Frontal sinusitis, unspecified chronicity  - amoxicillin-clavulanate (AUGMENTIN) 875-125 MG tablet; Take 1 tablet by mouth 2 (two) times daily for 10 days.  Dispense: 20  tablet; Refill: 0   Call if symptoms change or if not rapidly improving.       The entirety of the information documented in the History of Present Illness, Review of Systems and Physical Exam were personally obtained by me. Portions of this information were initially documented by the CMA and reviewed by me for thoroughness and accuracy.     Mila Merry, MD  [facility] [phone] (phone) [phone] (fax)  [facility]

## 2022-11-02 ENCOUNTER — Other Ambulatory Visit: Payer: Self-pay | Admitting: Family Medicine

## 2022-11-02 DIAGNOSIS — R052 Subacute cough: Secondary | ICD-10-CM

## 2022-11-02 MED ORDER — HYDROCODONE BIT-HOMATROP MBR 5-1.5 MG/5ML PO SOLN: 5.0000 mL | Freq: Three times a day (TID) | ORAL | 0 refills | Status: DC | PRN

## 2022-12-02 ENCOUNTER — Encounter: Payer: Self-pay | Admitting: Family Medicine

## 2022-12-02 NOTE — Telephone Encounter (Signed)
Please review.  KP

## 2022-12-06 ENCOUNTER — Other Ambulatory Visit: Payer: Self-pay | Admitting: Family Medicine

## 2022-12-06 DIAGNOSIS — I10 Essential (primary) hypertension: Secondary | ICD-10-CM

## 2022-12-07 ENCOUNTER — Encounter: Payer: Self-pay | Admitting: Family Medicine

## 2023-01-03 ENCOUNTER — Ambulatory Visit (INDEPENDENT_AMBULATORY_CARE_PROVIDER_SITE_OTHER): Payer: BC Managed Care – PPO | Admitting: Family Medicine

## 2023-01-03 ENCOUNTER — Encounter (INDEPENDENT_AMBULATORY_CARE_PROVIDER_SITE_OTHER): Payer: Self-pay | Admitting: Family Medicine

## 2023-01-03 VITALS — BP 158/96 | HR 86 | Temp 98.6°F | Ht 68.0 in | Wt 224.0 lb

## 2023-01-03 DIAGNOSIS — E669 Obesity, unspecified: Secondary | ICD-10-CM

## 2023-01-03 DIAGNOSIS — Z6834 Body mass index (BMI) 34.0-34.9, adult: Secondary | ICD-10-CM | POA: Diagnosis not present

## 2023-01-03 DIAGNOSIS — I10 Essential (primary) hypertension: Secondary | ICD-10-CM | POA: Diagnosis not present

## 2023-01-03 DIAGNOSIS — E7800 Pure hypercholesterolemia, unspecified: Secondary | ICD-10-CM

## 2023-01-03 DIAGNOSIS — Z0289 Encounter for other administrative examinations: Secondary | ICD-10-CM

## 2023-01-03 NOTE — Assessment & Plan Note (Signed)
Reviewed bioimpedance results Reviewed program information Reviewed his weight history Working third shift and skipping meals has likely been a factor in his weight gain He hopes to see improvements in hypertension and hyperlipidemia with weight loss

## 2023-01-03 NOTE — Progress Notes (Signed)
Office: [phone]  /  Fax: [phone]   Initial Visit  Arthur Bradley was seen in clinic today to evaluate for obesity. He is interested in losing weight to improve overall health and reduce the risk of weight related complications. He presents today to review program treatment options, initial physical assessment, and evaluation.     He was referred by: PCP  When asked what else they would like to accomplish? He states: Adopt healthier eating patterns, Reduce number of medications, and Improve quality of life  When asked how has your weight affected you? He states: Contributed to medical problems and Having fatigue  Some associated conditions: Hypertension and Hyperlipidemia  Contributing factors: Disruption of circadian rhythm and Stress  Weight promoting medications identified: None  Current nutrition plan: None  Current level of physical activity: None and NEAT  Current or previous pharmacotherapy: None  Response to medication: Never tried medications   Past medical history includes:  History reviewed. No pertinent past medical history.   Objective:   BP (!) 158/96   Pulse 86   Temp 98.6 F (37 C)   Ht 5' 8"$  (1.727 m)   Wt 224 lb (101.6 kg)   SpO2 99%   BMI 34.06 kg/m  He was weighed on the bioimpedance scale: Body mass index is 34.06 kg/m.    General:  Alert, oriented and cooperative. Patient is in no acute distress.  Respiratory: Normal respiratory effort, no problems with respiration noted  Extremities: Normal range of motion.    Mental Status: Normal mood and affect. Normal behavior. Normal judgment and thought content.   DIAGNOSTIC DATA REVIEWED:  BMET    Component Value Date/Time   NA 144 09/21/2021 1020   K 3.9 09/21/2021 1020   CL 101 09/21/2021 1020   CO2 26 09/21/2021 1020   GLUCOSE 100 (H) 09/21/2021 1020   BUN 18 09/21/2021 1020   CREATININE 1.47 (H) 09/21/2021 1020   CALCIUM 9.9 09/21/2021 1020   GFRNONAA 49 (L) 10/27/2020 1348    GFRAA 57 (L) 10/27/2020 1348   No results found for: "HGBA1C" No results found for: "INSULIN" CBC    Component Value Date/Time   WBC 5.0 10/06/2020 0916   RBC 5.24 10/06/2020 0916   HGB 15.9 10/06/2020 0916   HCT 46.8 10/06/2020 0916   PLT 226 10/06/2020 0916   MCV 89 10/06/2020 0916   MCH 30.3 10/06/2020 0916   MCHC 34.0 10/06/2020 0916   RDW 14.3 10/06/2020 0916   Iron/TIBC/Ferritin/ %Sat No results found for: "IRON", "TIBC", "FERRITIN", "IRONPCTSAT" Lipid Panel     Component Value Date/Time   CHOL 236 (H) 09/21/2021 1020   TRIG 125 09/21/2021 1020   HDL 42 09/21/2021 1020   CHOLHDL 5.6 (H) 09/21/2021 1020   LDLCALC 171 (H) 09/21/2021 1020   Hepatic Function Panel     Component Value Date/Time   PROT 7.9 10/06/2020 0916   ALBUMIN 5.1 (H) 09/21/2021 1020   AST 31 10/06/2020 0916   ALT 37 10/06/2020 0916   ALKPHOS 72 10/06/2020 0916   BILITOT 0.4 10/06/2020 0916      Component Value Date/Time   TSH 3.140 01/27/2016 1153     Assessment and Plan:   Essential hypertension Assessment & Plan: Blood pressure elevated today on amlodipine 10 mg once daily, Diovan HCTZ 320/25 mg once daily. Denies chest pain or headaches. Consumes high sodium restaurant foods on a daily basis   Generalized obesity Assessment & Plan: Reviewed bioimpedance results Reviewed program information Reviewed his  weight history Working third shift and skipping meals has likely been a factor in his weight gain He hopes to see improvements in hypertension and hyperlipidemia with weight loss   BMI 34.0-34.9,adult  Hypercholesteremia Assessment & Plan: Lab Results  Component Value Date   CHOL 236 (H) 09/21/2021   HDL 42 09/21/2021   LDLCALC 171 (H) 09/21/2021   TRIG 125 09/21/2021   CHOLHDL 5.6 (H) 09/21/2021    Plan to update fasting labs next visit Currently not on any lipid-lowering medications. Consumes a standard American diet with lack of regular exercise        Obesity  Treatment / Action Plan:  Patient will work on garnering support from family and friends to begin weight loss journey. Will work on eliminating or reducing the presence of highly palatable, calorie dense foods in the home. Will complete provided nutritional and psychosocial assessment questionnaire before the next appointment. Will be scheduled for indirect calorimetry to determine resting energy expenditure in a fasting state.  This will allow [place] to create a reduced calorie, high-protein meal plan to promote loss of fat mass while preserving muscle mass. Was counseled on nutritional approaches to weight loss and benefits of complex carbs and high quality protein as part of nutritional weight management. Was counseled on pharmacotherapy and role as an adjunct in weight management.   Obesity Education Performed Today:  He was weighed on the bioimpedance scale and results were discussed and documented in the synopsis.  We discussed obesity as a disease and the importance of a more detailed evaluation of all the factors contributing to the disease.  We discussed the importance of long term lifestyle changes which include nutrition, exercise and behavioral modifications as well as the importance of customizing this to his specific health and social needs.  We discussed the benefits of reaching a healthier weight to alleviate the symptoms of existing conditions and reduce the risks of the biomechanical, metabolic and psychological effects of obesity.  Arthur Bradley appears to be in the action stage of change and states they are ready to start intensive lifestyle modifications and behavioral modifications.  30 minutes was spent today on this visit including the above counseling, pre-visit chart review, and post-visit documentation.  Reviewed by clinician on day of visit: allergies, medications, problem list, medical history, surgical history, family history, social history, and previous encounter  notes.    Loyal Gambler DO

## 2023-01-03 NOTE — Assessment & Plan Note (Signed)
Blood pressure elevated today on amlodipine 10 mg once daily, Diovan HCTZ 320/25 mg once daily. Denies chest pain or headaches. Consumes high sodium restaurant foods on a daily basis

## 2023-01-03 NOTE — Assessment & Plan Note (Signed)
Lab Results  Component Value Date   CHOL 236 (H) 09/21/2021   HDL 42 09/21/2021   LDLCALC 171 (H) 09/21/2021   TRIG 125 09/21/2021   CHOLHDL 5.6 (H) 09/21/2021    Plan to update fasting labs next visit Currently not on any lipid-lowering medications. Consumes a standard American diet with lack of regular exercise

## 2023-01-06 ENCOUNTER — Encounter (INDEPENDENT_AMBULATORY_CARE_PROVIDER_SITE_OTHER): Payer: Self-pay | Admitting: Family Medicine

## 2023-01-06 ENCOUNTER — Ambulatory Visit (INDEPENDENT_AMBULATORY_CARE_PROVIDER_SITE_OTHER): Payer: BC Managed Care – PPO | Admitting: Family Medicine

## 2023-01-06 VITALS — BP 132/80 | HR 84 | Temp 97.5°F | Ht 68.0 in | Wt 223.0 lb

## 2023-01-06 DIAGNOSIS — F3289 Other specified depressive episodes: Secondary | ICD-10-CM | POA: Diagnosis not present

## 2023-01-06 DIAGNOSIS — E669 Obesity, unspecified: Secondary | ICD-10-CM

## 2023-01-06 DIAGNOSIS — K219 Gastro-esophageal reflux disease without esophagitis: Secondary | ICD-10-CM

## 2023-01-06 DIAGNOSIS — R0602 Shortness of breath: Secondary | ICD-10-CM

## 2023-01-06 DIAGNOSIS — Z1331 Encounter for screening for depression: Secondary | ICD-10-CM | POA: Diagnosis not present

## 2023-01-06 DIAGNOSIS — I10 Essential (primary) hypertension: Secondary | ICD-10-CM

## 2023-01-06 DIAGNOSIS — G4733 Obstructive sleep apnea (adult) (pediatric): Secondary | ICD-10-CM

## 2023-01-06 DIAGNOSIS — R5383 Other fatigue: Secondary | ICD-10-CM | POA: Diagnosis not present

## 2023-01-06 DIAGNOSIS — Z6834 Body mass index (BMI) 34.0-34.9, adult: Secondary | ICD-10-CM

## 2023-01-07 LAB — LIPID PANEL
Chol/HDL Ratio: 4.9 ratio (ref 0.0–5.0)
Cholesterol, Total: 205 mg/dL — ABNORMAL HIGH (ref 100–199)
HDL: 42 mg/dL (ref >39)
LDL Chol Calc (NIH): 94 mg/dL (ref 0–99)
Triglycerides: 415 mg/dL — ABNORMAL HIGH (ref 0–149)
VLDL Cholesterol Cal: 69 mg/dL — ABNORMAL HIGH (ref 5–40)

## 2023-01-07 LAB — CBC
Hematocrit: 43.5 % (ref 37.5–51.0)
Hemoglobin: 14.6 g/dL (ref 13.0–17.7)
MCH: 30.3 pg (ref 26.6–33.0)
MCHC: 33.6 g/dL (ref 31.5–35.7)
MCV: 90 fL (ref 79–97)
Platelets: 196 10*3/uL (ref 150–450)
RBC: 4.82 x10E6/uL (ref 4.14–5.80)
RDW: 14.7 % (ref 11.6–15.4)
WBC: 4 10*3/uL (ref 3.4–10.8)

## 2023-01-07 LAB — COMPREHENSIVE METABOLIC PANEL
ALT: 44 IU/L (ref 0–44)
AST: 28 IU/L (ref 0–40)
Albumin/Globulin Ratio: 1.6 (ref 1.2–2.2)
Albumin: 4.9 g/dL (ref 4.1–5.1)
Alkaline Phosphatase: 64 IU/L (ref 44–121)
BUN/Creatinine Ratio: 11 (ref 9–20)
BUN: 15 mg/dL (ref 6–24)
Bilirubin Total: 0.8 mg/dL (ref 0.0–1.2)
CO2: 22 mmol/L (ref 20–29)
Calcium: 10.2 mg/dL (ref 8.7–10.2)
Chloride: 96 mmol/L (ref 96–106)
Creatinine, Ser: 1.41 mg/dL — ABNORMAL HIGH (ref 0.76–1.27)
Globulin, Total: 3 g/dL (ref 1.5–4.5)
Glucose: 96 mg/dL (ref 70–99)
Potassium: 3.8 mmol/L (ref 3.5–5.2)
Sodium: 138 mmol/L (ref 134–144)
Total Protein: 7.9 g/dL (ref 6.0–8.5)
eGFR: 62 mL/min/{1.73_m2} (ref >59)

## 2023-01-07 LAB — HEMOGLOBIN A1C
Est. average glucose Bld gHb Est-mCnc: 123 mg/dL
Hgb A1c MFr Bld: 5.9 % — ABNORMAL HIGH (ref 4.8–5.6)

## 2023-01-07 LAB — VITAMIN D 25 HYDROXY (VIT D DEFICIENCY, FRACTURES): Vit D, 25-Hydroxy: 25.5 ng/mL — ABNORMAL LOW (ref 30.0–100.0)

## 2023-01-07 LAB — INSULIN, RANDOM: INSULIN: 31.1 u[IU]/mL — ABNORMAL HIGH (ref 2.6–24.9)

## 2023-01-07 LAB — TSH RFX ON ABNORMAL TO FREE T4: TSH: 2.73 u[IU]/mL (ref 0.450–4.500)

## 2023-01-07 LAB — VITAMIN B12: Vitamin B-12: 621 pg/mL (ref 232–1245)

## 2023-01-07 NOTE — Progress Notes (Unsigned)
[place]ina P[place],acting as a scribe for Lelon Huh, MD.,have documented all relevant documentation on the behalf of Lelon Huh, MD,as directed by  Lelon Huh, MD while in the presence of Lelon Huh, MD.     Established patient visit   Patient: Arthur Bradley   DOB: Jun 23, 1976   47 y.o. Male  MRN: [id number] Visit Date: 01/10/2023  Today's healthcare provider: Lelon Huh, MD   No chief complaint on file.  Subjective    HPI  Hypertension, follow-up  BP Readings from Last 3 Encounters:  01/06/23 132/80  01/03/23 (!) 158/96  10/22/22 (!) 131/92   Wt Readings from Last 3 Encounters:  01/06/23 223 lb (101.2 kg)  01/03/23 224 lb (101.6 kg)  10/22/22 230 lb (104.3 kg)     He was last seen for hypertension 4 months ago.  BP at that visit was as above. Management since that visit includes no changes, did refer to weight management and advised will need to add another blood pressure medication if not improved.  He reports {excellent/good/fair/poor:19665} compliance with treatment. He {is/is not:9024} having side effects. {document side effects if present:1} He is following a {diet:21022986} diet. He {is/is not:9024} exercising. He {does/does not:200015} smoke.  Use of agents associated with hypertension: {bp agents assoc with hypertension:511::"none"}.   Outside blood pressures are {***enter patient reported home BP readings, or 'not being checked':1}. Symptoms: {Yes/No:20286} chest pain {Yes/No:20286} chest pressure  {Yes/No:20286} palpitations {Yes/No:20286} syncope  {Yes/No:20286} dyspnea {Yes/No:20286} orthopnea  {Yes/No:20286} paroxysmal nocturnal dyspnea {Yes/No:20286} lower extremity edema   Pertinent labs Lab Results  Component Value Date   CHOL 205 (H) 01/06/2023   HDL 42 01/06/2023   LDLCALC 94 01/06/2023   TRIG 415 (H) 01/06/2023   CHOLHDL 4.9 01/06/2023   Lab Results  Component Value Date   NA 138 01/06/2023   K 3.8 01/06/2023   CREATININE  1.41 (H) 01/06/2023   EGFR 62 01/06/2023   GLUCOSE 96 01/06/2023   TSH 2.730 01/06/2023     The 10-year ASCVD risk score (Arnett DK, et al., 2019) is: 13.6%  ---------------------------------------------------------------------------------------------------   Medications: Outpatient Medications Prior to Visit  Medication Sig   albuterol (VENTOLIN HFA) 108 (90 Base) MCG/ACT inhaler Inhale 2 puffs into the lungs every 6 (six) hours as needed for wheezing or shortness of breath. (Patient not taking: Reported on 01/06/2023)   amLODipine (NORVASC) 10 MG tablet Take 1 tablet (10 mg total) by mouth daily.   buPROPion (WELLBUTRIN SR) 150 MG 12 hr tablet Take 1 tablet (150 mg total) by mouth daily. (Patient not taking: Reported on 01/06/2023)   fluticasone (FLONASE) 50 MCG/ACT nasal spray Place 2 sprays into both nostrils daily. (Patient not taking: Reported on 01/06/2023)   guaiFENesin (MUCINEX PO) Take by mouth as needed. (Patient not taking: Reported on 01/06/2023)   HYDROcodone bit-homatropine (HYCODAN) 5-1.5 MG/5ML syrup Take 5 mLs by mouth every 8 (eight) hours as needed for cough. (Patient not taking: Reported on 01/06/2023)   Pseudoeph-Doxylamine-DM-APAP (NYQUIL PO) Take by mouth as needed. (Patient not taking: Reported on 01/06/2023)   sildenafil (VIAGRA) 100 MG tablet Take 0.5-1 tablets (50-100 mg total) by mouth daily as needed for erectile dysfunction. (Patient not taking: Reported on 01/06/2023)   valsartan-hydrochlorothiazide (DIOVAN-HCT) 320-25 MG tablet TAKE 1 TABLET BY MOUTH EVERY DAY   No facility-administered medications prior to visit.    Review of Systems  {Labs  Heme  Chem  Endocrine  Serology  Results Review (optional):23779}   Objective    There  were no vitals taken for this visit. {Show previous vital signs (optional):23777}  Physical Exam  ***  No results found for any visits on 01/10/23.  Assessment & Plan     ***  No follow-ups on file.      {provider  attestation***:1}   Lelon Huh, MD  [place] [phone] (phone) [phone] (fax)  [place]

## 2023-01-10 ENCOUNTER — Encounter: Payer: Self-pay | Admitting: Family Medicine

## 2023-01-10 ENCOUNTER — Ambulatory Visit: Payer: BC Managed Care – PPO | Admitting: Family Medicine

## 2023-01-10 VITALS — BP 148/101 | HR 94 | Ht 68.0 in | Wt 233.3 lb

## 2023-01-10 DIAGNOSIS — Z6835 Body mass index (BMI) 35.0-35.9, adult: Secondary | ICD-10-CM | POA: Diagnosis not present

## 2023-01-10 DIAGNOSIS — I10 Essential (primary) hypertension: Secondary | ICD-10-CM | POA: Diagnosis not present

## 2023-01-10 NOTE — Patient Instructions (Signed)
.   Please review the attached list of medications and notify my office if there are any errors.   . Please bring all of your medications to every appointment so we can make sure that our medication list is the same as yours.   

## 2023-01-13 DIAGNOSIS — H524 Presbyopia: Secondary | ICD-10-CM | POA: Diagnosis not present

## 2023-01-13 DIAGNOSIS — H5203 Hypermetropia, bilateral: Secondary | ICD-10-CM | POA: Diagnosis not present

## 2023-01-13 DIAGNOSIS — H35033 Hypertensive retinopathy, bilateral: Secondary | ICD-10-CM | POA: Diagnosis not present

## 2023-01-19 NOTE — Progress Notes (Signed)
Thank you for referring Arthur Bradley to our clinic. The following note includes my evaluation and treatment recommendations.  Chief Complaint:   OBESITY Arthur Bradley (MR# [id number]) is a 47 y.o. male who presents for evaluation and treatment of obesity and related comorbidities. Current BMI is Body mass index is 33.91 kg/m. Arthur Bradley has been struggling with his weight for many years and has been unsuccessful in either losing weight, maintaining weight loss, or reaching his healthy weight goal.  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  He wants to lose 30 LBS and he is stressed eater.  He drinks 2 regular stones daily.  He drinks liquor every night, 2-3 shots, she drinks to sleep at night.  Arthur Bradley is currently in the action stage of change and ready to dedicate time achieving and maintaining a healthier weight. Arthur Bradley is interested in becoming our patient and working on intensive lifestyle modifications including (but not limited to) diet and exercise for weight loss.  Arthur Bradley's habits were reviewed today and are as follows: His family eats meals together, he thinks his family will eat healthier with him, he struggles with family and or coworkers weight loss sabotage, his desired weight loss is 38 lbs, he has been heavy most of his life, he started gaining weight at age 59, his heaviest weight ever was 250 pounds, he is a picky eater and doesn't like to eat healthier foods, he has significant food cravings issues, he snacks frequently in the evenings, he skips meals frequently, he is trying to follow a vegetarian diet, he is frequently drinking liquids with calories, he frequently makes poor food choices, he frequently eats larger portions than normal, and he struggles with emotional eating.  Depression Screen Arthur Bradley's Food and Mood (modified PHQ-9) score was 24.   Subjective:   1. Other fatigue Arthur Bradley admits to daytime somnolence and admits to  waking up still tired. Patient has a history of symptoms of daytime fatigue, morning fatigue, and morning headache. Arthur Bradley generally gets 5 hours of sleep per night, and states that he has nightime awakenings. Snoring is present. Apneic episodes are present. Epworth Sleepiness Score is 17.   2. SOBOE (shortness of breath on exertion) Arthur Bradley notes increasing shortness of breath with exercising and seems to be worsening over time with weight gain. He notes getting out of breath sooner with activity than he used to. This has not gotten worse recently. Arthur Bradley denies shortness of breath at rest or orthopnea.  3. Essential Hypertension Patient is on amlodipine 10 mg daily, valsartan/HCTZ 120-25 mg daily.  Patient denies chest pain or shortness of breath.  4. OSA (obstructive sleep apnea) Epworth score:17 Patient wears CPAP but lacks adequate sleep.  5. Gastroesophageal reflux disease, unspecified whether esophagitis present Patient is not on daily meds for reflux.  Patient drinks 2-3 shots of liquor before bed.  6. Other depression, with emotional eating Bariatric PHQ-9:24.  Patient is on Wellbutrin SR 150 mg daily.  Assessment/Plan:   1. Other fatigue Arthur Bradley does feel that his weight is causing his energy to be lower than it should be. Fatigue may be related to obesity, depression or many other causes. Labs will be ordered, and in the meanwhile, Arthur Bradley will focus on self care including making healthy food choices, increasing physical activity and focusing on stress reduction.  - EKG 12-Lead - VITAMIN D 25 Hydroxy (Vit-D Deficiency, Fractures) - Comprehensive metabolic panel - Lipid  panel - Vitamin B12 - CBC - TSH Rfx on Abnormal to Free T4 - Hemoglobin A1c - Insulin, random  2. SOBOE (shortness of breath on exertion) Arthur Bradley does feel that he gets out of breath more easily that he used to when he exercises. Arthur Bradley's shortness of breath appears to be obesity related and exercise induced. He has  agreed to work on weight loss and gradually increase exercise to treat his exercise induced shortness of breath. Will continue to monitor closely.  - EKG 12-Lead  3. Essential Hypertension Watch her blood pressure improvements with weight loss.  4. OSA (obstructive sleep apnea) Aim for 6 to 7 hours of consolidated sleep with CPAP.  5. Gastroesophageal reflux disease, unspecified whether esophagitis present Reduce EtOH intake and began prescribed diet.  Workout and weight loss.   6. Other depression, with emotional eating Begin working on stress reduction and mindful eating.  7. Depression screening Arthur Bradley had a positive depression screening. Depression is commonly associated with obesity and often results in emotional eating behaviors. We will monitor this closely and work on CBT to help improve the non-hunger eating patterns. Referral to Psychology may be required if no improvement is seen as he continues in our clinic.  8. Obesity, current BMI 34.0 1.  Reduce alcohol intake to 1 a day. 2.  Firm up plan for home workouts.  Arthur Bradley is currently in the action stage of change and his goal is to continue with weight loss efforts. I recommend Arthur Bradley begin the structured treatment plan as follows:  He has agreed to the Category 3 Plan.  Exercise goals: Home workouts, cardio, weights 3 times per week.  Behavioral modification strategies: increasing lean protein intake, increasing vegetables, increasing water intake, decreasing alcohol intake, no skipping meals, meal planning and cooking strategies, keeping healthy foods in the home, planning for success, and decreasing junk food.  He was informed of the importance of frequent follow-up visits to maximize his success with intensive lifestyle modifications for his multiple health conditions. He was informed we would discuss his lab results at his next visit unless there is a critical issue that needs to be addressed sooner. Arthur Bradley agreed to keep  his next visit at the agreed upon time to discuss these results.  Objective:   Blood pressure 132/80, pulse 84, temperature (!) 97.5 F (36.4 C), height '5\' 8"'$  (1.727 m), weight 223 lb (101.2 kg), SpO2 97 %. Body mass index is 33.91 kg/m.  EKG: Normal sinus rhythm, rate 89 bpm.  Indirect Calorimeter completed today shows a VO2 of 275 and a REE of 1901.  His calculated basal metabolic rate is [id number] thus his basal metabolic rate is worse than expected.  General: Cooperative, alert, well developed, in no acute distress. HEENT: Conjunctivae and lids unremarkable. Cardiovascular: Regular rhythm.  Lungs: Normal work of breathing. Neurologic: No focal deficits.   Lab Results  Component Value Date   CREATININE 1.41 (H) 01/06/2023   BUN 15 01/06/2023   NA 138 01/06/2023   K 3.8 01/06/2023   CL 96 01/06/2023   CO2 22 01/06/2023   Lab Results  Component Value Date   ALT 44 01/06/2023   AST 28 01/06/2023   ALKPHOS 64 01/06/2023   BILITOT 0.8 01/06/2023   Lab Results  Component Value Date   HGBA1C 5.9 (H) 01/06/2023   Lab Results  Component Value Date   INSULIN 31.1 (H) 01/06/2023   Lab Results  Component Value Date   TSH 2.730 01/06/2023   Lab Results  Component Value Date   CHOL 205 (H) 01/06/2023   HDL 42 01/06/2023   LDLCALC 94 01/06/2023   TRIG 415 (H) 01/06/2023   CHOLHDL 4.9 01/06/2023   Lab Results  Component Value Date   WBC 4.0 01/06/2023   HGB 14.6 01/06/2023   HCT 43.5 01/06/2023   MCV 90 01/06/2023   PLT 196 01/06/2023   No results found for: "IRON", "TIBC", "FERRITIN"  Attestation Statements:   Reviewed by clinician on day of visit: allergies, medications, problem list, medical history, surgical history, family history, social history, and previous encounter notes.  Time spent on visit including pre-visit chart review and post-visit charting and care was 40 minutes.   I, Arthur Bradley, am acting as Location manager for Loyal Gambler, DO.  I have  reviewed the above documentation for accuracy and completeness, and I agree with the above. Dell Ponto, DO

## 2023-01-20 ENCOUNTER — Ambulatory Visit (INDEPENDENT_AMBULATORY_CARE_PROVIDER_SITE_OTHER): Payer: BC Managed Care – PPO | Admitting: Internal Medicine

## 2023-01-20 VITALS — BP 132/82 | Temp 98.4°F | Ht 68.0 in | Wt 222.0 lb

## 2023-01-20 DIAGNOSIS — E669 Obesity, unspecified: Secondary | ICD-10-CM

## 2023-01-20 DIAGNOSIS — Z6833 Body mass index (BMI) 33.0-33.9, adult: Secondary | ICD-10-CM

## 2023-01-20 DIAGNOSIS — I10 Essential (primary) hypertension: Secondary | ICD-10-CM

## 2023-01-20 DIAGNOSIS — E559 Vitamin D deficiency, unspecified: Secondary | ICD-10-CM

## 2023-01-20 DIAGNOSIS — E781 Pure hyperglyceridemia: Secondary | ICD-10-CM | POA: Diagnosis not present

## 2023-01-20 DIAGNOSIS — R7303 Prediabetes: Secondary | ICD-10-CM | POA: Diagnosis not present

## 2023-01-20 NOTE — Assessment & Plan Note (Signed)
His LDL has improved in comparison to previous but he has markedly elevated triglycerides.  He acknowledges he diet high in simple and added sugars prior to starting program.. His 10 year risk is: The 10-year ASCVD risk score (Arnett DK, et al., 2019) is: 13.6%  Lab Results  Component Value Date   CHOL 205 (H) 01/06/2023   HDL 42 01/06/2023   LDLCALC 94 01/06/2023   TRIG 415 (H) 01/06/2023   CHOLHDL 4.9 01/06/2023    Continue weight loss therapy, losing 10% or more of body weight may improve condition.  He will also work on reducing simple and added sugars in diet as well as maintaining saturated fats to less than 10% of calories.  We will repeat lipid profile in 3 months

## 2023-01-20 NOTE — Assessment & Plan Note (Signed)
Most recent A1c is  Lab Results  Component Value Date   HGBA1C 5.9 (H) 01/06/2023  With elevated insulin levels. Patient informed of disease state and risk of progression. This may contribute to abnormal cravings, fatigue and diabetes complications without having diabetes.   We reviewed treatment options which include losing 7 to 10% of body weight, increasing physical activity to a 150 minutes a week of moderate intensity.He may also be a candidate for pharmacoprophylaxis with metformin or incretin mimetic.  He will review this with Dr. Valetta Close at his next follow-up.

## 2023-01-20 NOTE — Assessment & Plan Note (Signed)
Most recent vitamin D levels  Lab Results  Component Value Date   VD25OH 25.5 (L) 01/06/2023     Deficiency state associated with adiposity and may result in leptin resistance, weight gain and fatigue.  He is currently taking men's multivitamin.  I recommend that he supplement with vitamin D3 2000 international units daily.

## 2023-01-20 NOTE — Progress Notes (Signed)
Office: [phone]  /  Fax: [phone]  WEIGHT SUMMARY AND BIOMETRICS  Vitals Temp: 98.4 F (36.9 C) BP: 132/82   Anthropometric Measurements Height: '5\' 8"'$  (1.727 m) Weight: 222 lb (100.7 kg) BMI (Calculated): 33.76 Weight at Last Visit: 1 lb Starting Weight: 223 lb Total Weight Loss (lbs): 1 lb (0.454 kg)   Body Composition  Body Fat %: 269 % Fat Mass (lbs): 59.8 lbs Muscle Mass (lbs): 154.6 lbs Total Body Water (lbs): 1068 lbs Visceral Fat Rating : 14   Other Clinical Data Fasting: No Labs: No Today's Visit #: 2 Starting Date: 01/06/23    HPI  Chief Complaint: OBESITY  Arthur Bradley is here to discuss his progress with his obesity treatment plan. He is unsure of the plan and states he is following his eating plan approximately 75 % of the time. He states he is exercising 15 minutes 3 times per week.  Interval History:  Since last office visit he has lost 1 pound. He reports fair adherence to prescribed reduced calorie nutrition plan. He has been working on gradual implementation and understanding approach. '[x]'$ Denies '[]'$ Reports problems with appetite and hunger signals.  '[x]'$ Denies '[]'$ Reports problems with satiety and satiation.  '[x]'$ Denies '[]'$ Reports problems with eating patterns and portion control.  '[x]'$ Denies '[]'$ Reports abnormal cravings Barriers identified inability to cook healthy meals.  picky eater and doesn't like to eat healthier foods, he has significant food cravings issues, he snacks frequently in the evenings, he skips meals frequently, he is trying to follow a vegetarian diet, he is frequently drinking liquids with calories, he frequently makes poor food choices, he frequently eats larger portions than normal, and he struggles with emotional eating.  Pharmacotherapy for weight loss: He is currently taking no anti-obesity medication.   Weight promoting medications identified: None.  ASSESSMENT AND PLAN  TREATMENT PLAN FOR OBESITY:  Recommended  Dietary Goals  Randahl is currently in the action stage of change. As such, his goal is to continue weight management plan. He has agreed to: the Category 3 Plan.  Behavioral Intervention  We discussed the following Behavioral Modification Strategies today: increasing lean protein intake, decreasing simple carbohydrates , increasing vegetables, increasing lower glycemic fruits, increasing fiber rich foods, avoiding skipping meals, increasing water intake, work on meal planning and easy cooking plans, decreasing eating out, consumption of processed foods, and making healthy choices when eating convenient foods, and reading food labels .  Additional resources provided today: Provided with handout on healthy plate, sources of lean protein and complex carbs.  We also reviewed insulin resistance and its effect on weight.  Recommended Physical Activity Goals  [name] has been advised to work up to 150 minutes of moderate intensity aerobic activity a week and strengthening exercises 2-3 times per week for cardiovascular health, weight loss maintenance and preservation of muscle mass.    Pharmacotherapy We discussed various medication options to help [name] with his weight loss efforts and we both agreed to continue with nutritional and behavioral strategies.  He did inquire about GLP-1 therapy and is interested.  I conveyed to him that we would like for him to work on nutritional, behavioral and increasing physical activity and that we will use medications as an adjunct as needed down the line.  ASSOCIATED CONDITIONS ADDRESSED TODAY  Prediabetes Assessment & Plan: Most recent A1c is  Lab Results  Component Value Date   HGBA1C 5.9 (H) 01/06/2023  With elevated insulin levels. Patient informed of disease state and risk of progression. This may contribute to abnormal  cravings, fatigue and diabetes complications without having diabetes.   We reviewed treatment options which include losing 7 to 10% of  body weight, increasing physical activity to a 150 minutes a week of moderate intensity.He may also be a candidate for pharmacoprophylaxis with metformin or incretin mimetic.  He will review this with Dr. Valetta Close at his next follow-up.    Vitamin D deficiency Assessment & Plan: Most recent vitamin D levels  Lab Results  Component Value Date   VD25OH 25.5 (L) 01/06/2023     Deficiency state associated with adiposity and may result in leptin resistance, weight gain and fatigue.  He is currently taking men's multivitamin.  I recommend that he supplement with vitamin D3 2000 international units daily.     Obesity, current BMI 33  Essential hypertension Assessment & Plan: Blood pressure above target.  On Valsartan hydrochlorothiazide and amlodipine 10 mg a day without adverse effects.  Most recent renal parameters reviewed showed an improvement in GFR which is in the low normal range.  Continue with weight loss therapy.  Losing 10% of body weight may improve condition.  Monitor for symptoms of orthostasis while losing weight. Continue current regimen and home monitoring for a goal blood pressure of 120/80.  Patient also advised to increase fluid intake and avoid nephrotoxins.    Pure hypertriglyceridemia Assessment & Plan: His LDL has improved in comparison to previous but he has markedly elevated triglycerides.  He acknowledges he diet high in simple and added sugars prior to starting program.. His 10 year risk is: The 10-year ASCVD risk score (Arnett DK, et al., 2019) is: 13.6%  Lab Results  Component Value Date   CHOL 205 (H) 01/06/2023   HDL 42 01/06/2023   LDLCALC 94 01/06/2023   TRIG 415 (H) 01/06/2023   CHOLHDL 4.9 01/06/2023    Continue weight loss therapy, losing 10% or more of body weight may improve condition.  He will also work on reducing simple and added sugars in diet as well as maintaining saturated fats to less than 10% of calories.  We will repeat lipid profile in 3  months         PHYSICAL EXAM:  Blood pressure 132/82, temperature 98.4 F (36.9 C), height '5\' 8"'$  (1.727 m), weight 222 lb (100.7 kg). Body mass index is 33.75 kg/m.  General: He is overweight, cooperative, alert, well developed, and in no acute distress. PSYCH: Has normal mood, affect and thought process.   HEENT: EOMI, sclerae are anicteric. Lungs: Normal breathing effort, no conversational dyspnea. Extremities: No edema.  Neurologic: No gross sensory or motor deficits. No tremors or fasciculations noted.    DIAGNOSTIC DATA REVIEWED:  BMET    Component Value Date/Time   NA 138 01/06/2023 1009   K 3.8 01/06/2023 1009   CL 96 01/06/2023 1009   CO2 22 01/06/2023 1009   GLUCOSE 96 01/06/2023 1009   BUN 15 01/06/2023 1009   CREATININE 1.41 (H) 01/06/2023 1009   CALCIUM 10.2 01/06/2023 1009   GFRNONAA 49 (L) 10/27/2020 1348   GFRAA 57 (L) 10/27/2020 1348   Lab Results  Component Value Date   HGBA1C 5.9 (H) 01/06/2023   Lab Results  Component Value Date   INSULIN 31.1 (H) 01/06/2023   Lab Results  Component Value Date   TSH 2.730 01/06/2023   CBC    Component Value Date/Time   WBC 4.0 01/06/2023 1009   RBC 4.82 01/06/2023 1009   HGB 14.6 01/06/2023 1009   HCT 43.5 01/06/2023  1009   PLT 196 01/06/2023 1009   MCV 90 01/06/2023 1009   MCH 30.3 01/06/2023 1009   MCHC 33.6 01/06/2023 1009   RDW 14.7 01/06/2023 1009   Iron Studies No results found for: "IRON", "TIBC", "FERRITIN", "IRONPCTSAT" Lipid Panel     Component Value Date/Time   CHOL 205 (H) 01/06/2023 1009   TRIG 415 (H) 01/06/2023 1009   HDL 42 01/06/2023 1009   CHOLHDL 4.9 01/06/2023 1009   LDLCALC 94 01/06/2023 1009   Hepatic Function Panel     Component Value Date/Time   PROT 7.9 01/06/2023 1009   ALBUMIN 4.9 01/06/2023 1009   AST 28 01/06/2023 1009   ALT 44 01/06/2023 1009   ALKPHOS 64 01/06/2023 1009   BILITOT 0.8 01/06/2023 1009      Component Value Date/Time   TSH 2.730  01/06/2023 1009   TSH 3.140 01/27/2016 1153   Nutritional Lab Results  Component Value Date   VD25OH 25.5 (L) 01/06/2023     Return in about 2 weeks (around 02/03/2023) for Dr. Valetta Close .Marland Kitchen He was informed of the importance of frequent follow up visits to maximize his success with intensive lifestyle modifications for his multiple health conditions.   ATTESTASTION STATEMENTS:  Reviewed by clinician on day of visit: allergies, medications, problem list, medical history, surgical history, family history, social history, and previous encounter notes.   Time spent on visit including pre-visit chart review and post-visit care and charting was 40 minutes.    Thomes Dinning, MD

## 2023-01-20 NOTE — Assessment & Plan Note (Signed)
Blood pressure above target.  On Valsartan hydrochlorothiazide and amlodipine 10 mg a day without adverse effects.  Most recent renal parameters reviewed showed an improvement in GFR which is in the low normal range.  Continue with weight loss therapy.  Losing 10% of body weight may improve condition.  Monitor for symptoms of orthostasis while losing weight. Continue current regimen and home monitoring for a goal blood pressure of 120/80.  Patient also advised to increase fluid intake and avoid nephrotoxins.

## 2023-03-07 ENCOUNTER — Ambulatory Visit (INDEPENDENT_AMBULATORY_CARE_PROVIDER_SITE_OTHER): Payer: BC Managed Care – PPO | Admitting: Family Medicine

## 2023-03-08 ENCOUNTER — Other Ambulatory Visit: Payer: Self-pay | Admitting: Family Medicine

## 2023-03-08 DIAGNOSIS — I10 Essential (primary) hypertension: Secondary | ICD-10-CM

## 2023-03-28 ENCOUNTER — Encounter: Payer: Self-pay | Admitting: Family Medicine

## 2023-06-27 ENCOUNTER — Encounter: Payer: Self-pay | Admitting: Family Medicine

## 2023-06-27 ENCOUNTER — Ambulatory Visit (INDEPENDENT_AMBULATORY_CARE_PROVIDER_SITE_OTHER): Payer: BC Managed Care – PPO | Admitting: Family Medicine

## 2023-06-27 VITALS — BP 124/75 | HR 87 | Ht 68.5 in | Wt 224.9 lb

## 2023-06-27 DIAGNOSIS — Z1211 Encounter for screening for malignant neoplasm of colon: Secondary | ICD-10-CM

## 2023-06-27 DIAGNOSIS — I10 Essential (primary) hypertension: Secondary | ICD-10-CM | POA: Diagnosis not present

## 2023-06-27 DIAGNOSIS — Z1159 Encounter for screening for other viral diseases: Secondary | ICD-10-CM

## 2023-06-27 DIAGNOSIS — E7800 Pure hypercholesterolemia, unspecified: Secondary | ICD-10-CM | POA: Diagnosis not present

## 2023-06-27 DIAGNOSIS — Z0000 Encounter for general adult medical examination without abnormal findings: Secondary | ICD-10-CM | POA: Diagnosis not present

## 2023-06-27 DIAGNOSIS — H6122 Impacted cerumen, left ear: Secondary | ICD-10-CM

## 2023-06-27 DIAGNOSIS — E559 Vitamin D deficiency, unspecified: Secondary | ICD-10-CM

## 2023-06-27 DIAGNOSIS — R7303 Prediabetes: Secondary | ICD-10-CM | POA: Diagnosis not present

## 2023-06-27 NOTE — Progress Notes (Signed)
Complete physical exam   Patient: Arthur Bradley   DOB: [birth year]   47 y.o. Male  MRN: [id number] Visit Date: 06/27/2023  Today's healthcare provider: Mila Merry, MD   Chief Complaint  Patient presents with   Annual Exam   Subjective    [name] is a 47 y.o. male who presents today for a complete physical exam.  He reports consuming a  regular  diet.  He generally feels well. He reports sleeping fairly well. He does not have additional problems to discuss today.   He is working on smoking cessation, he tried bupropion which he thought was helpful, but was having some trouble sleeping. He still has some of the medication left and is considering starting back on it. Otherwise feels well and tolerating current medications.   Past Medical History:  Diagnosis Date   Alcohol abuse    Constipation    Heartburn    Hypertension    Lactose intolerance    Sleep apnea    SOB (shortness of breath)    Past Surgical History:  Procedure Laterality Date   NO PAST SURGERIES     Social History   Socioeconomic History   Marital status: Married    Spouse name: Arthur Bradley   Number of children: Not on file   Years of education: Not on file   Highest education level: Not on file  Occupational History   Occupation: Full-time    Comment: Warehouse   Occupation: Hydrographic surveyor [employer]  Tobacco Use   Smoking status: Every Day    Current packs/day: 0.50    Average packs/day: 0.5 packs/day for 20.0 years (10.0 ttl pk-yrs)    Types: E-cigarettes, Cigarettes   Smokeless tobacco: Never  Substance and Sexual Activity   Alcohol use: Yes    Comment: Rarely   Drug use: No   Sexual activity: Not on file  Other Topics Concern   Not on file  Social History Narrative   Not on file   Social Determinants of Health   Financial Resource Strain: Not on file  Food Insecurity: Not on file  Transportation Needs: Not on file  Physical Activity: Not on file  Stress: Not on file   Social Connections: Not on file  Intimate Partner Violence: Not on file   Family Status  Relation Name Status   Mother  Alive   Father  Alive       MI at 83   Sister  Alive   Daughter  Alive       ADHD   Son  Press photographer  (Not Specified)  No partnership data on file   Family History  Problem Relation Age of Onset   Eating disorder Mother    Arthritis Mother    Obesity Mother    Hypertension Father    Heart attack Father    Heart disease Father    Alcoholism Father    Hypertension Sister    Diabetes Paternal Uncle    No Known Allergies  Patient Care Team: Malva Limes, MD as PCP - General (Family Medicine)   Medications: Outpatient Medications Prior to Visit  Medication Sig   albuterol (VENTOLIN HFA) 108 (90 Base) MCG/ACT inhaler Inhale 2 puffs into the lungs every 6 (six) hours as needed for wheezing or shortness of breath.   amLODipine (NORVASC) 10 MG tablet TAKE 1 TABLET BY MOUTH EVERY DAY   buPROPion (WELLBUTRIN SR) 150 MG 12 hr tablet Take 1  tablet (150 mg total) by mouth daily. (Patient taking differently: Take 150 mg by mouth daily. As needed)   fluticasone (FLONASE) 50 MCG/ACT nasal spray Place 2 sprays into both nostrils daily. (Patient taking differently: Place 2 sprays into both nostrils daily. As needed)   Pseudoeph-Doxylamine-DM-APAP (NYQUIL PO) Take by mouth as needed.   valsartan-hydrochlorothiazide (DIOVAN-HCT) 320-25 MG tablet TAKE 1 TABLET BY MOUTH EVERY DAY   sildenafil (VIAGRA) 100 MG tablet Take 0.5-1 tablets (50-100 mg total) by mouth daily as needed for erectile dysfunction. (Patient not taking: Reported on 06/27/2023)   No facility-administered medications prior to visit.    Review of Systems  Constitutional:  Negative for chills, diaphoresis and fever.  HENT:  Negative for congestion, ear discharge, ear pain, hearing loss, nosebleeds, sore throat and tinnitus.   Eyes:  Negative for photophobia, pain, discharge and redness.   Respiratory:  Negative for cough, shortness of breath, wheezing and stridor.   Cardiovascular:  Negative for chest pain, palpitations and leg swelling.  Gastrointestinal:  Negative for abdominal pain, blood in stool, constipation, diarrhea, nausea and vomiting.  Endocrine: Negative for polydipsia.  Genitourinary:  Negative for dysuria, flank pain, frequency, hematuria and urgency.  Musculoskeletal:  Negative for back pain, myalgias and neck pain.  Skin:  Negative for rash.  Allergic/Immunologic: Negative for environmental allergies.  Neurological:  Negative for dizziness, tremors, seizures, weakness and headaches.  Hematological:  Does not bruise/bleed easily.  Psychiatric/Behavioral:  Negative for hallucinations and suicidal ideas. The patient is not nervous/anxious.      Objective    BP 124/75 (BP Location: Left Arm, Patient Position: Sitting, Cuff Size: Normal)   Pulse 87   Ht 5' 8.5" (1.74 m)   Wt 224 lb 14.4 oz (102 kg)   BMI 33.70 kg/m  Wt Readings from Last 3 Encounters:  06/27/23 224 lb 14.4 oz (102 kg)  01/20/23 222 lb (100.7 kg)  01/10/23 233 lb 4.8 oz (105.8 kg)    Physical Exam   General Appearance:    Obese male. Alert, cooperative, in no acute distress, appears stated age  Head:    Normocephalic, without obvious abnormality, atraumatic  Eyes:    PERRL, conjunctiva/corneas clear, EOM's intact, fundi    benign, both eyes       Ears:    Normal TM' right, left obstructed by cerumen.   Nose:   Nares normal, septum midline, mucosa normal, no drainage   or sinus tenderness  Throat:   Lips, mucosa, and tongue normal; teeth and gums normal  Neck:   Supple, symmetrical, trachea midline, no adenopathy;       thyroid:  No enlargement/tenderness/nodules; no carotid   bruit or JVD  Back:     Symmetric, no curvature, ROM normal, no CVA tenderness  Lungs:     Clear to auscultation bilaterally, respirations unlabored  Chest wall:    No tenderness or deformity  Heart:     Normal heart rate. Normal rhythm. No murmurs, rubs, or gallops.  S1 and S2 normal  Abdomen:     Soft, non-tender, bowel sounds active all four quadrants,    no masses, no organomegaly  Genitalia:    deferred  Rectal:    deferred  Extremities:   All extremities are intact. No cyanosis or edema  Pulses:   2+ and symmetric all extremities  Skin:   Skin color, texture, turgor normal, no rashes or lesions  Lymph nodes:   Cervical, supraclavicular, and axillary nodes normal  Neurologic:   CNII-XII intact.  Normal strength, sensation and reflexes      throughout     Last depression screening scores    06/27/2023    8:30 AM 01/10/2023    8:21 AM 01/06/2023    9:17 AM  PHQ 2/9 Scores  PHQ - 2 Score 0 2 6  PHQ- 9 Score  12 24   Last fall risk screening    06/27/2023    8:30 AM  Fall Risk   Falls in the past year? 0  Injury with Fall? 0  Risk for fall due to : No Fall Risks  Follow up Falls evaluation completed   Last Audit-C alcohol use screening    01/10/2023    8:20 AM  Alcohol Use Disorder Test (AUDIT)  1. How often do you have a drink containing alcohol? 3  2. How many drinks containing alcohol do you have on a typical day when you are drinking? 0  3. How often do you have six or more drinks on one occasion? 3  AUDIT-C Score 6   A score of 3 or more in women, and 4 or more in men indicates increased risk for alcohol abuse, EXCEPT if all of the points are from question 1   No results found for any visits on 06/27/23.  Assessment & Plan    Routine Health Maintenance and Physical Exam  Exercise Activities and Dietary recommendations  Goals   None     Immunization History  Administered Date(s) Administered   PFIZER(Purple Top)SARS-COV-2 Vaccination 05/23/2020, 06/13/2020    Health Maintenance  Topic Date Due   HIV Screening  Never done   Hepatitis C Screening  Never done   DTaP/Tdap/Td (1 - Tdap) Never done   COVID-19 Vaccine (3 - Pfizer risk series) 07/11/2020    Colonoscopy  Never done   INFLUENZA VACCINE  06/23/2023   HPV VACCINES  Aged Out    Discussed health benefits of physical activity, and encouraged him to engage in regular exercise appropriate for his age and condition.  2. Essential hypertension Well controlled.  Continue current medications.    3. Hypercholesteremia Diet controlled. - CBC - Comprehensive metabolic panel - Lipid panel  4. Vitamin D deficiency  - VITAMIN D 25 Hydroxy (Vit-D Deficiency, Fractures)  5. Prediabetes  - Hemoglobin A1c  6. Need for hepatitis C screening test  - Hepatitis C antibody  7. Colon cancer screening  - Ambulatory referral to gastroenterology for colonoscopy  8. Left ear impacted cerumen Uses home peroxide ear solution        Mila Merry, MD  [facility] [phone] (phone) [phone] (fax)  [facility]

## 2023-06-27 NOTE — Patient Instructions (Signed)
Colonoscopy, Adult A colonoscopy is an exam to look at the large intestine. It is done using a long, thin, flexible tube that has a camera on the end. This exam is done to check for problems, such as: An abnormal growth of cells or tissue (tumor). Abnormal growths within the lining of your intestine (polyps). Irritation and swelling (inflammation). Bleeding. Tell your doctor about: Any allergies you have. All medicines you are taking. Tell him or her about vitamins, herbs, eye drops, creams, and over-the-counter medicines. Any problems you or family members have had with anesthetic medicines. Any bleeding problems you have. Any surgeries you have had. Any medical conditions you have. Any problems you have had with pooping (having bowel movements). Whether you are pregnant or may be pregnant. What are the risks? Generally, this is a safe procedure. However, problems may occur, such as: Bleeding. Damage to your intestine. Allergic reactions to medicines given during the procedure. Infection. This is rare. What happens before the procedure? Eating and drinking Follow instructions from your doctor about eating and drinking. These may include: A few days before the procedure: Follow a low-fiber diet. Avoid these foods: Nuts. Seeds. Dried fruit. Raw fruits. Vegetables. 1-3 days before the procedure: Eat only gelatin dessert or ice pops. Drink only clear liquids, such as: Water. Clear broth or bouillon. Black coffee or tea. Clear juice. Clear soft drinks or sports drinks. Avoid liquids that have red or purple dye. On the day of the procedure: Do not eat solid foods. You may continue to drink clear liquids until up to 2 hours before the procedure. Do not eat or drink anything starting 2 hours before the procedure or as told by your doctor. Bowel prep If you were prescribed a bowel prep to take by mouth (orally) to clean out your colon: Take it as told by your doctor. Starting  the day before your procedure, you will need to drink a lot of liquid medicine. The liquid will cause you to poop until your poop is almost clear or light green. If your skin or butt gets irritated from diarrhea, you may: Wipe the area with wipes that have medicine in them, such as adult wet wipes with aloe and vitamin E. Put something on your skin that soothes the area, such as petroleum jelly. If you vomit while drinking the bowel prep: Take a break for up to 60 minutes. Begin the bowel prep again. Call your doctor if you keep vomiting and you cannot take the bowel prep without vomiting. To clean out your colon, you may also be given: Laxative medicines. These help you poop. Instructions for using a liquid medicine (enema) injected into your butt. Medicines Ask your doctor about changing or stopping: Your normal medicines. Vitamins, herbs, and supplements. Over-the-counter medicines. Do not take aspirin or ibuprofen unless you are told to. General instructions Ask your doctor what steps will be taken to prevent the spread of germs. These may include washing skin with a soap that kills germs. If you will be going home right after the procedure, plan to have a responsible adult: Take you home from the hospital or clinic. You will not be allowed to drive. Care for you for the time you are told. What happens during the procedure?  An IV tube will be put into one of your veins. You will be given a medicine to make you fall asleep (general anesthetic). You will lie on your side with your knees bent. Oil or gel will be put on  the tube. Then the tube will be: Put into the opening of the butt (anus). Gently put into your large intestine. Air will be sent into your colon to keep it open. You may feel some pressure or cramping. The camera will be used to take photos that will appear on a screen. A small tissue sample may be removed for testing (biopsy). If small growths are found, your doctor  may remove them and have them checked for cancer. The tube will be slowly removed. The procedure may vary among doctors and hospitals. What happens after the procedure? You will be monitored until you leave the hospital or clinic. This includes checking your blood pressure, heart rate, breathing rate, and blood oxygen level. You may have a small amount of blood in your poop. You may pass gas. You may have mild cramps or bloating in your belly (abdomen). If you were given a sedative during your procedure, do not drive or use machines until your doctor says that it is safe. It is up to you to get the results of your procedure. Ask how to get your results when they are ready. Summary A colonoscopy is an exam to look at the large intestine. Follow instructions from your doctor about eating and drinking before the procedure. You may be prescribed an oral bowel prep to clean out your colon. Take it as told by your doctor. A flexible tube with a camera on its end will be put into the opening of the butt. It will be passed into the large intestine. This information is not intended to replace advice given to you by your health care provider. Make sure you discuss any questions you have with your health care provider. Document Revised: 11/02/2021 Document Reviewed: 07/01/2021 Elsevier Patient Education  2024 [employer].

## 2023-06-28 ENCOUNTER — Telehealth: Payer: Self-pay

## 2023-06-28 LAB — HEMOGLOBIN A1C
Est. average glucose Bld gHb Est-mCnc: 131 mg/dL
Hgb A1c MFr Bld: 6.2 % — ABNORMAL HIGH (ref 4.8–5.6)

## 2023-06-28 LAB — VITAMIN D 25 HYDROXY (VIT D DEFICIENCY, FRACTURES): Vit D, 25-Hydroxy: 33.1 ng/mL (ref 30.0–100.0)

## 2023-06-28 NOTE — Telephone Encounter (Signed)
Discussed colonoscopy referral with patient.  He would like to schedule but wants to check with his employer to see when would be a good time for him to take off.  I informed him that I will send a copy of instructions as well so he will know how he will need to prepare before hand.  Thanks,  [place], [place]

## 2023-07-04 ENCOUNTER — Telehealth: Payer: Self-pay | Admitting: Family Medicine

## 2023-07-04 ENCOUNTER — Other Ambulatory Visit: Payer: Self-pay | Admitting: Family Medicine

## 2023-07-04 DIAGNOSIS — E781 Pure hyperglyceridemia: Secondary | ICD-10-CM

## 2023-07-04 MED ORDER — ICOSAPENT ETHYL 1 G PO CAPS
2.0000 g | ORAL_CAPSULE | Freq: Two times a day (BID) | ORAL | 3 refills | Status: AC
Start: 2023-07-04 — End: ?

## 2023-07-04 NOTE — Telephone Encounter (Signed)
[employer] is requesting prescription refill Key: [id number] Name: Arthur Bradley 1GM capsules

## 2023-07-06 NOTE — Telephone Encounter (Signed)
Approval letter received.

## 2023-12-20 ENCOUNTER — Other Ambulatory Visit: Payer: Self-pay | Admitting: Family Medicine

## 2023-12-20 DIAGNOSIS — I10 Essential (primary) hypertension: Secondary | ICD-10-CM

## 2024-01-19 DIAGNOSIS — Z03818 Encounter for observation for suspected exposure to other biological agents ruled out: Secondary | ICD-10-CM | POA: Diagnosis not present

## 2024-01-19 DIAGNOSIS — R051 Acute cough: Secondary | ICD-10-CM | POA: Diagnosis not present

## 2024-01-19 DIAGNOSIS — J101 Influenza due to other identified influenza virus with other respiratory manifestations: Secondary | ICD-10-CM | POA: Diagnosis not present

## 2024-02-21 ENCOUNTER — Ambulatory Visit: Payer: Self-pay | Admitting: Family Medicine

## 2024-02-21 ENCOUNTER — Encounter: Payer: Self-pay | Admitting: Family Medicine

## 2024-02-21 VITALS — BP 136/96 | HR 94 | Ht 68.5 in | Wt 230.2 lb

## 2024-02-21 DIAGNOSIS — M25512 Pain in left shoulder: Secondary | ICD-10-CM | POA: Diagnosis not present

## 2024-02-21 MED ORDER — CYCLOBENZAPRINE HCL 5 MG PO TABS: 5.0000 mg | ORAL_TABLET | Freq: Three times a day (TID) | ORAL | 1 refills | Status: AC | PRN

## 2024-02-21 MED ORDER — FLUTICASONE PROPIONATE 50 MCG/ACT NA SUSP: 2.0000 | Freq: Every day | NASAL | 1 refills | Status: AC

## 2024-02-21 NOTE — Progress Notes (Signed)
 Acute visit   Patient: Arthur Bradley   DOB: 06-Mar-1976   48 y.o. Male  MRN: [id number] PCP: Malva Limes, MD   Chief Complaint  Patient presents with   Shoulder Pain    Left shoulder pain x 1-2 weeks ago due to previous accident from years ago. Radiates down arm. Can be constant when laying down and can feel it in shoulder when trying to lift arm. Reports if he lifts a certain way he can lift without as much pain. Pain causing him to wake up at night. Patient uses muscle relaxant gel from otc and tylenol sometimes but it does not give any relief.     Subjective    Discussed the use of AI scribe software for clinical note transcription with the patient, who gave verbal consent to proceed.  History of Present Illness   Arthur Bradley, a patient with a history of a left shoulder injury from a workplace accident approximately four to five years ago, presents with worsening left shoulder pain over the past one to two weeks. The pain is described as radiating from the shoulder down the arm, and is particularly severe when attempting to lift the arm or reach behind. The patient reports difficulty in raising the arm overhead and often has to use the other arm for assistance. The patient has been self-managing the pain with muscle relaxant creams and Tylenol, but reports that these measures have not been effective. The pain has remained consistent over the past week or two, with no reported numbness or tingling in the arm. The patient does report occasional weakness in the affected arm.        Review of Systems  Objective    BP (!) 136/96 (BP Location: Right Arm, Patient Position: Sitting, Cuff Size: Large)   Pulse 94   Ht 5' 8.5" (1.74 m)   Wt 230 lb 3.2 oz (104.4 kg)   SpO2 100%   BMI 34.49 kg/m  Physical Exam   Physical Exam   CHEST: Lungs clear to auscultation bilaterally. MUSCULOSKELETAL: Shoulder shrug normal. Left shoulder flexion limited to 90 degrees with pain. Left shoulder  abduction limited to 90 degrees with pain. Left shoulder internal rotation painful. Left shoulder external rotation painful. Left shoulder empty can test positive.        No results found for any visits on 02/21/24.  Assessment & Plan     Problem List Items Addressed This Visit   None Visit Diagnoses       Acute pain of left shoulder    -  Primary   Relevant Orders   Ambulatory referral to Orthopedic Surgery       Assessment and Plan    Left shoulder rotator cuff sprain Presents with left shoulder pain exacerbated by lifting the arm and external rotation, flaring up over the last week or two. No significant injury at the time of the initial incident four to five years ago. Physical examination reveals pain with external rotation and overhead motion, suggesting a rotator cuff issue. Strength is relatively preserved, indicating likely a sprain rather than a tear. 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 Physical therapy may suffice, but referral to an orthopedist is advised for further eval. NSAIDs are avoided due to kidney function concerns, opting for Tylenol and Flexeril instead. - Refer to orthopedist at [facility] for further evaluation - Prescribe extra strength Tylenol, 1000 mg in the morning and at night, with an optional third dose  as needed - Prescribe Flexeril for muscle relaxation, cautioning about potential drowsiness - Advise icing the shoulder after activity or if pain occurs - Provide gentle shoulder stretches to prevent freezing - Instruct to follow up if no contact from orthopedist  Chronic kidney disease Chronic kidney disease with last GFR of 47, indicating moderate kidney impairment. NSAIDs like ibuprofen or meloxicam are avoided due to potential nephrotoxicity. Tylenol and Flexeril are chosen to manage pain and inflammation, balancing efficacy with safety given his kidney function. - Avoid NSAIDs due to kidney function - Monitor  kidney function regularly         Meds ordered this encounter  Medications   fluticasone (FLONASE) 50 MCG/ACT nasal spray    Sig: Place 2 sprays into both nostrils daily.    Dispense:  48 mL    Refill:  1   cyclobenzaprine (FLEXERIL) 5 MG tablet    Sig: Take 1 tablet (5 mg total) by mouth 3 (three) times daily as needed for muscle spasms.    Dispense:  30 tablet    Refill:  1     Return if symptoms worsen or fail to improve.      Shirlee Latch, MD  [facility] [phone] (phone) [phone] (fax)  [facility]

## 2024-03-02 DIAGNOSIS — M7542 Impingement syndrome of left shoulder: Secondary | ICD-10-CM | POA: Diagnosis not present

## 2024-03-22 ENCOUNTER — Other Ambulatory Visit: Payer: Self-pay | Admitting: Family Medicine

## 2024-03-22 DIAGNOSIS — I10 Essential (primary) hypertension: Secondary | ICD-10-CM

## 2024-06-20 ENCOUNTER — Other Ambulatory Visit: Payer: Self-pay | Admitting: Family Medicine

## 2024-06-20 DIAGNOSIS — I10 Essential (primary) hypertension: Secondary | ICD-10-CM

## 2024-07-17 ENCOUNTER — Other Ambulatory Visit: Payer: Self-pay | Admitting: Family Medicine

## 2024-07-17 DIAGNOSIS — I10 Essential (primary) hypertension: Secondary | ICD-10-CM

## 2024-09-16 ENCOUNTER — Other Ambulatory Visit: Payer: Self-pay | Admitting: Family Medicine

## 2024-09-16 DIAGNOSIS — I10 Essential (primary) hypertension: Secondary | ICD-10-CM

## 2024-10-22 ENCOUNTER — Other Ambulatory Visit: Payer: Self-pay | Admitting: Family Medicine

## 2024-10-22 DIAGNOSIS — I10 Essential (primary) hypertension: Secondary | ICD-10-CM

## 2024-11-20 ENCOUNTER — Other Ambulatory Visit: Payer: Self-pay | Admitting: Family Medicine

## 2024-11-20 DIAGNOSIS — I10 Essential (primary) hypertension: Secondary | ICD-10-CM

## 2024-11-28 ENCOUNTER — Other Ambulatory Visit: Payer: Self-pay | Admitting: Family Medicine

## 2024-11-28 DIAGNOSIS — I10 Essential (primary) hypertension: Secondary | ICD-10-CM

## 2024-12-02 ENCOUNTER — Other Ambulatory Visit: Payer: Self-pay | Admitting: Family Medicine

## 2024-12-02 DIAGNOSIS — I10 Essential (primary) hypertension: Secondary | ICD-10-CM

## 2024-12-12 ENCOUNTER — Other Ambulatory Visit: Payer: Self-pay | Admitting: Family Medicine

## 2024-12-12 DIAGNOSIS — I10 Essential (primary) hypertension: Secondary | ICD-10-CM

## 2024-12-27 ENCOUNTER — Other Ambulatory Visit: Payer: Self-pay | Admitting: Family Medicine

## 2024-12-27 DIAGNOSIS — I10 Essential (primary) hypertension: Secondary | ICD-10-CM
